# Patient Record
Sex: Female | Born: 1955
Health system: Southern US, Community
[De-identification: ages and names within clinical notes are randomized; demographics above are authoritative.]

## PROBLEM LIST (undated history)

## (undated) DIAGNOSIS — R001 Bradycardia, unspecified: Secondary | ICD-10-CM

## (undated) DIAGNOSIS — L719 Rosacea, unspecified: Secondary | ICD-10-CM

## (undated) DIAGNOSIS — I10 Essential (primary) hypertension: Secondary | ICD-10-CM

## (undated) HISTORY — PX: TONSILLECTOMY: SUR1361

## (undated) HISTORY — PX: NASAL SEPTUM SURGERY: SHX37

## (undated) HISTORY — DX: Bradycardia, unspecified: R00.1

## (undated) HISTORY — DX: Rosacea, unspecified: L71.9

## (undated) HISTORY — DX: Essential (primary) hypertension: I10

---

## 1999-10-09 ENCOUNTER — Other Ambulatory Visit: Admission: RE | Admit: 1999-10-09 | Discharge: 1999-10-09 | Payer: Self-pay | Admitting: Obstetrics and Gynecology

## 2000-02-28 ENCOUNTER — Encounter: Admission: RE | Admit: 2000-02-28 | Discharge: 2000-02-28 | Payer: Self-pay | Admitting: Obstetrics and Gynecology

## 2000-02-28 ENCOUNTER — Encounter: Payer: Self-pay | Admitting: Obstetrics and Gynecology

## 2000-06-12 ENCOUNTER — Other Ambulatory Visit: Admission: RE | Admit: 2000-06-12 | Discharge: 2000-06-12 | Payer: Self-pay | Admitting: Obstetrics and Gynecology

## 2001-03-18 ENCOUNTER — Encounter: Admission: RE | Admit: 2001-03-18 | Discharge: 2001-03-18 | Payer: Self-pay | Admitting: Internal Medicine

## 2001-03-18 ENCOUNTER — Encounter: Payer: Self-pay | Admitting: Obstetrics and Gynecology

## 2001-07-12 ENCOUNTER — Other Ambulatory Visit: Admission: RE | Admit: 2001-07-12 | Discharge: 2001-07-12 | Payer: Self-pay | Admitting: Obstetrics and Gynecology

## 2002-04-04 ENCOUNTER — Encounter: Admission: RE | Admit: 2002-04-04 | Discharge: 2002-04-04 | Payer: Self-pay | Admitting: Obstetrics and Gynecology

## 2002-04-04 ENCOUNTER — Encounter: Payer: Self-pay | Admitting: Obstetrics and Gynecology

## 2003-04-13 ENCOUNTER — Encounter: Admission: RE | Admit: 2003-04-13 | Discharge: 2003-04-13 | Payer: Self-pay | Admitting: *Deleted

## 2003-04-13 ENCOUNTER — Encounter: Payer: Self-pay | Admitting: *Deleted

## 2003-08-31 ENCOUNTER — Other Ambulatory Visit: Admission: RE | Admit: 2003-08-31 | Discharge: 2003-08-31 | Payer: Self-pay | Admitting: *Deleted

## 2004-05-09 ENCOUNTER — Encounter: Admission: RE | Admit: 2004-05-09 | Discharge: 2004-05-09 | Payer: Self-pay | Admitting: *Deleted

## 2004-09-05 ENCOUNTER — Other Ambulatory Visit: Admission: RE | Admit: 2004-09-05 | Discharge: 2004-09-05 | Payer: Self-pay | Admitting: *Deleted

## 2004-12-24 ENCOUNTER — Ambulatory Visit: Payer: Self-pay | Admitting: Internal Medicine

## 2005-05-27 ENCOUNTER — Encounter: Admission: RE | Admit: 2005-05-27 | Discharge: 2005-05-27 | Payer: Self-pay | Admitting: *Deleted

## 2005-06-26 ENCOUNTER — Ambulatory Visit: Payer: Self-pay | Admitting: Internal Medicine

## 2005-06-30 ENCOUNTER — Ambulatory Visit: Payer: Self-pay | Admitting: Internal Medicine

## 2005-09-03 ENCOUNTER — Other Ambulatory Visit: Admission: RE | Admit: 2005-09-03 | Discharge: 2005-09-03 | Payer: Self-pay | Admitting: *Deleted

## 2005-11-04 ENCOUNTER — Ambulatory Visit: Payer: Self-pay | Admitting: Pulmonary Disease

## 2005-11-05 ENCOUNTER — Ambulatory Visit: Payer: Self-pay | Admitting: Internal Medicine

## 2006-03-11 ENCOUNTER — Ambulatory Visit: Payer: Self-pay | Admitting: Internal Medicine

## 2006-06-08 ENCOUNTER — Encounter: Admission: RE | Admit: 2006-06-08 | Discharge: 2006-06-08 | Payer: Self-pay | Admitting: *Deleted

## 2006-08-13 ENCOUNTER — Ambulatory Visit: Payer: Self-pay | Admitting: Internal Medicine

## 2006-08-25 ENCOUNTER — Ambulatory Visit: Payer: Self-pay | Admitting: Internal Medicine

## 2006-09-01 ENCOUNTER — Other Ambulatory Visit: Admission: RE | Admit: 2006-09-01 | Discharge: 2006-09-01 | Payer: Self-pay | Admitting: *Deleted

## 2007-02-08 ENCOUNTER — Ambulatory Visit: Payer: Self-pay | Admitting: Internal Medicine

## 2007-06-10 ENCOUNTER — Encounter: Admission: RE | Admit: 2007-06-10 | Discharge: 2007-06-10 | Payer: Self-pay | Admitting: *Deleted

## 2007-07-08 ENCOUNTER — Ambulatory Visit: Payer: Self-pay | Admitting: Internal Medicine

## 2007-10-08 ENCOUNTER — Ambulatory Visit: Payer: Self-pay | Admitting: Internal Medicine

## 2008-06-27 ENCOUNTER — Encounter: Admission: RE | Admit: 2008-06-27 | Discharge: 2008-06-27 | Payer: Self-pay | Admitting: Family Medicine

## 2008-11-21 ENCOUNTER — Telehealth (INDEPENDENT_AMBULATORY_CARE_PROVIDER_SITE_OTHER): Payer: Self-pay | Admitting: *Deleted

## 2008-12-04 LAB — CONVERTED CEMR LAB: Pap Smear: NORMAL

## 2008-12-29 DIAGNOSIS — J309 Allergic rhinitis, unspecified: Secondary | ICD-10-CM | POA: Insufficient documentation

## 2008-12-29 DIAGNOSIS — J45998 Other asthma: Secondary | ICD-10-CM | POA: Insufficient documentation

## 2009-01-01 ENCOUNTER — Ambulatory Visit: Payer: Self-pay | Admitting: Internal Medicine

## 2009-02-08 ENCOUNTER — Ambulatory Visit: Payer: Self-pay | Admitting: Internal Medicine

## 2009-02-08 DIAGNOSIS — R9431 Abnormal electrocardiogram [ECG] [EKG]: Secondary | ICD-10-CM | POA: Insufficient documentation

## 2009-02-08 DIAGNOSIS — R030 Elevated blood-pressure reading, without diagnosis of hypertension: Secondary | ICD-10-CM | POA: Insufficient documentation

## 2009-02-08 LAB — CONVERTED CEMR LAB
Albumin: 4.2 g/dL (ref 3.5–5.2)
Alkaline Phosphatase: 90 units/L (ref 39–117)
BUN: 10 mg/dL (ref 6–23)
Basophils Relative: 0.4 % (ref 0.0–3.0)
Bilirubin Urine: NEGATIVE
Chloride: 105 meq/L (ref 96–112)
Cholesterol: 177 mg/dL (ref 0–200)
GFR calc Af Amer: 97 mL/min
Hemoglobin, Urine: NEGATIVE
Hemoglobin: 13.4 g/dL (ref 12.0–15.0)
Ketones, ur: NEGATIVE mg/dL
LDL Cholesterol: 92 mg/dL (ref 0–99)
Leukocytes, UA: NEGATIVE
Lymphocytes Relative: 22.9 % (ref 12.0–46.0)
Monocytes Absolute: 0.5 10*3/uL (ref 0.1–1.0)
Monocytes Relative: 6.7 % (ref 3.0–12.0)
Mucus, UA: NEGATIVE
Neutro Abs: 4.7 10*3/uL (ref 1.4–7.7)
Neutrophils Relative %: 67.6 % (ref 43.0–77.0)
RBC: 4.34 M/uL (ref 3.87–5.11)
RDW: 11.7 % (ref 11.5–14.6)
Specific Gravity, Urine: <=1.005 (ref 1.000–1.035)
TSH: 2.13 microintl units/mL (ref 0.35–5.50)
Total CHOL/HDL Ratio: 2.9
Total Protein, Urine: NEGATIVE mg/dL
Total Protein: 7.7 g/dL (ref 6.0–8.3)
Triglycerides: 116 mg/dL (ref 0–149)
WBC, UA: NONE SEEN cells/hpf
pH: 6 (ref 5.0–8.0)

## 2009-02-09 ENCOUNTER — Encounter: Payer: Self-pay | Admitting: Internal Medicine

## 2009-04-18 ENCOUNTER — Ambulatory Visit: Payer: Self-pay | Admitting: Internal Medicine

## 2009-07-11 ENCOUNTER — Encounter: Admission: RE | Admit: 2009-07-11 | Discharge: 2009-07-11 | Payer: Self-pay | Admitting: Internal Medicine

## 2010-02-25 ENCOUNTER — Ambulatory Visit: Payer: Self-pay | Admitting: Internal Medicine

## 2010-07-15 ENCOUNTER — Encounter: Admission: RE | Admit: 2010-07-15 | Discharge: 2010-07-15 | Payer: Self-pay | Admitting: Obstetrics and Gynecology

## 2011-01-02 NOTE — Assessment & Plan Note (Signed)
Summary: rov ///kp   Primary Provider/Referring Provider:  Etta Grandchild MD  CC:  Follow up visit-no complaints and breathing okay.Marland Kitchen  History of Present Illness:  10/09/07-:  HISTORY:  One year follow up. She says that she has done well over the past year until the past week when she has a cold treated with Mucinex DM. She plans to get flu vaccine at work. She continues allergy vaccine at 1 to 10 with no problems. We discussed this. She is basically satisfied with her care and status.   01-31-2009- Asthma, allergic rhinitis Asked to spend some time talking about changing primary doctors Asks about H1N1 vac- has had seasonal flu vax Admits mild nasal congestion but feels she has  been well.  Denies headache, purulent discharge or wheeze. She came off allergy shots. Taking only supplements with no decongestants or antihistamines. Using Advair, not needing rescue inhaler.  February 25, 2010- Asthma, allergic rhinitis No significant bronchitis since last here. She credits an herbal product. Didn't get flu shot this year, but I suggested she should continue to get it each year. She is more careful about dust control at home. Liked otc loratadine. Has not felt need to resume allergy shots.    Current Medications (verified): 1)  Advair Diskus 250-50 Mcg/dose Misc (Fluticasone-Salmeterol) .Marland Kitchen.. 1 Puff Two Times A Day; Rinse Mouth Well 2)  Proair Hfa 108 (90 Base) Mcg/act Aers (Albuterol Sulfate) .... 2 Puffs Four Times A Day As Needed 3)  Epipen 0.3 Mg/0.54ml (1:1000) Devi (Epinephrine Hcl (Anaphylaxis)) .... Use As Directed As Needed  Allergies (verified): No Known Drug Allergies  Past History:  Past Medical History: Last updated: 12/29/2008 Allergic Rhinitis Asthma  Past Surgical History: Last updated: 2009-01-31 Tonsillectomy Nasal septoplasty  Family History: Last updated: 2009/01/31 Father  died- MI Mother- died bowel obstruction, coronary artery disease  Social  History: Last updated: 04/18/2009 Works [employer] Patient states former smoker- was light social only Never married, no children Drug use-no Regular exercise-no  Risk Factors: Alcohol Use: 0 (04/18/2009) >5 drinks/d w/in last 3 months: no (02/08/2009) Caffeine Use: very little (02/08/2009) Diet: normal (02/08/2009) Exercise: no (04/18/2009)  Risk Factors: Smoking Status: never (04/18/2009)  Review of Systems      See HPI  The patient denies anorexia, fever, weight loss, weight gain, vision loss, decreased hearing, hoarseness, chest pain, syncope, dyspnea on exertion, peripheral edema, prolonged cough, headaches, hemoptysis, abdominal pain, and severe indigestion/heartburn.    Vital Signs:  Patient profile:   55 year old female Height:      67 inches O2 Sat:      98 % on Room air Pulse rate:   54 / minute BP sitting:   148 / 88  (right arm) Cuff size:   regular  Vitals Entered By: Reynaldo Minium CMA (February 25, 2010 9:29 AM)  O2 Flow:  Room air  Physical Exam  Additional Exam:  General: A/Ox3; pleasant and cooperative, NAD, SKIN: no rash, lesions NODES: no lymphadenopathy HEENT: [place]/AT, EOM- WNL, Conjuctivae- clear, PERRLA, TM-WNL, Nose- mild nasal stuffy, Throat- clear and wnl NECK: Supple w/ fair ROM, JVD- none, normal carotid impulses w/o bruits Thyroid- normal to palpation CHEST: Clear to P&A HEART: RRR, no m/g/r heard ABDOMEN: Soft and nl; EAV:WUJW, nl pulses, no edema  NEURO: Grossly intact to observation      Impression & Recommendations:  Problem # 1:  ALLERGIC RHINITIS (ICD-477.9)  Minor stuffiness. We discussed effect of decongestants on BP. She is aware of pollen and  dust effects and environmental cautions. We don't need to resume allergy vaccine now.  Orders: Est. Patient Level III (16109)  Problem # 2:  ASTHMA (ICD-493.90) We discussed option to reduce the Advair strength. She is only using it once daily and she was uncomfortable with the  change.  Patient Instructions: 1)  Please schedule a follow-up appointment in 1 year. 2)  Refill scripts for Advair and Proair Prescriptions: PROAIR HFA 108 (90 BASE) MCG/ACT AERS (ALBUTEROL SULFATE) 2 puffs four times a day as needed  #1 x prn   Entered and Authorized by:   Waymon Budge MD   Signed by:   Waymon Budge MD on 02/25/2010   Method used:   Print then Give to Patient   RxID:   [id number] ADVAIR DISKUS 250-50 MCG/DOSE MISC (FLUTICASONE-SALMETEROL) 1 puff two times a day; RINSE MOUTH WELL  #1 x prn   Entered and Authorized by:   Waymon Budge MD   Signed by:   Waymon Budge MD on 02/25/2010   Method used:   Print then Give to Patient   RxID:   [id number]

## 2011-02-18 ENCOUNTER — Encounter: Payer: Self-pay | Admitting: Internal Medicine

## 2011-02-25 ENCOUNTER — Encounter: Payer: Self-pay | Admitting: Internal Medicine

## 2011-02-25 ENCOUNTER — Ambulatory Visit (INDEPENDENT_AMBULATORY_CARE_PROVIDER_SITE_OTHER): Payer: Commercial Indemnity | Admitting: Internal Medicine

## 2011-02-25 VITALS — BP 138/72 | HR 53 | Ht 67.0 in

## 2011-02-25 DIAGNOSIS — J45909 Unspecified asthma, uncomplicated: Secondary | ICD-10-CM

## 2011-02-25 DIAGNOSIS — J309 Allergic rhinitis, unspecified: Secondary | ICD-10-CM

## 2011-02-25 MED ORDER — ALBUTEROL SULFATE HFA 108 (90 BASE) MCG/ACT IN AERS: 2.0000 | INHALATION_SPRAY | Freq: Four times a day (QID) | RESPIRATORY_TRACT | Status: DC | PRN

## 2011-02-25 MED ORDER — FLUTICASONE-SALMETEROL 100-50 MCG/DOSE IN AEPB: 1.0000 | INHALATION_SPRAY | Freq: Two times a day (BID) | RESPIRATORY_TRACT | Status: DC

## 2011-02-25 NOTE — Assessment & Plan Note (Addendum)
OTC antihistamines are sufficient now, but we discussed use in anticipation of exposure later in the Spring, as with lawn mowing. Also should wear a mask.  She has remained comfortable since completing allergy shots.

## 2011-02-25 NOTE — Patient Instructions (Addendum)
Try using sample/ script Advair 100/50 instead of 250-50. Same directions- one puff and rinse mouth, twice daily If this doesn't seem to hold you, we will go back to the 250/  Keep a script available for a rescue inhaler to use if Advair occasionally isn't enough.  Ok to use otc antihistamine of choice as needed for itch, drainage in nose and throat.

## 2011-02-25 NOTE — Progress Notes (Signed)
  Subjective:    Patient ID: [place] Haning, female    DOB: August 10, 1956, 55 y.o.   MRN: [id number]  HPI 50 yoF followed here for allergic rhinitis and asthma. No major health events in the past year. She is not having nasal congestion or drainage. Later Spring/ grass mowing is usually a worse time. As she mows more, she expects more throat irritation as pollen season evolves. She has needed to use a rescue inhaler only very rarely. We spent 15 minutes discussing relative merits of changing to low dose Advair, and of holistic/ alternative meds. Mask for mowing. She chose not to be weighed.    Review of Systems See HPI   Constitutional:   No weight loss, night sweats,  Fevers, chills, fatigue, lassitude. HEENT:   No headaches,  Difficulty swallowing,  Tooth/dental problems,  Sore throat,                No sneezing, itching, ear ache, nasal congestion, post nasal drip,   CV:  No chest pain,  Orthopnea, PND, swelling in lower extremities, anasarca, dizziness, palpitations  GI  No heartburn, indigestion, abdominal pain, nausea, vomiting, diarrhea, change in bowel habits, loss of appetite  Resp: No shortness of breath with exertion or at rest.  No excess mucus, no productive cough,  No non-productive cough,  No coughing up of blood.  No change in color of mucus.  No wheezing.    Skin: no rash or lesions.  GU: no dysuria, change in color of urine, no urgency or frequency.  No flank pain.  MS:  No joint pain or swelling.  No decreased range of motion.  No back pain.  Psych:  No change in mood or affect. No depression or anxiety.  No memory loss.  Objective:   Physical Exam    General- Alert, Oriented, Affect-appropriate, Distress- none acute  Skin- rash-none, lesions- none, excoriation- none  Lymphadenopathy- none  Head- atraumatic  Eyes- Gross vision intact, PERRLA, conjunctivae clear, secretions  Ears-Normal- Hearing, canals, Tm L ,   R ,  Nose- Clear, No-Septal dev, mucus, polyps,  erosion, perforation   Throat- Mallampati II , mucosa clear , drainage- none, tonsils- atrophic  Neck- flexible , trachea midline, no stridor , thyroid nl, carotid no bruit  Chest - symmetrical excursion , unlabored     Heart/CV- RRR , no murmur , no gallop  , no rub, nl s1 s2                     - JVD- none , edema- none, stasis changes- none, varices- none     Lung- clear to P&A, wheeze- none, cough- none , dullness-none, rub- none     Chest wall-   Abd- tender-no, distended-no, bowel sounds-present, HSM- no  Br/ Gen/ Rectal- Not done, not indicated  Extrem- cyanosis- none, clubbing, none, atrophy- none, strength- nl  Neuro- grossly intact to observation    Assessment & Plan:

## 2011-02-25 NOTE — Assessment & Plan Note (Addendum)
She is doing very well. I would like her to try lower dose Advair and to keep a rescue inhaler available.  Medication talk done.

## 2011-02-27 ENCOUNTER — Encounter: Payer: Self-pay | Admitting: Internal Medicine

## 2011-04-15 NOTE — Assessment & Plan Note (Signed)
[facility]                             PULMONARY OFFICE NOTE   NAME:Yvette Walton, Yvette Walton                         MRN:          [id number]  DATE:10/08/2007                            DOB:          1956-06-22    PROBLEMS:  1. Asthma.  2. Allergic rhinitis.   HISTORY:  One year follow up. She says that she has done well over the  past year until the past week when she has a cold treated with Mucinex  DM. She plans to get flu vaccine at work. She continues allergy vaccine  at 1 to 10 with no problems. We discussed this. She is basically  satisfied with her care and status.   MEDICATIONS:  1. Allergy vaccine.  2. Advair 250/50 is usually used once daily and was discussed.  3. Nortrel 1/35.  4. Pro Air albuterol inhaler.   She does have an EpiPen available.   ALLERGIES:  No medication allergy.   OBJECTIVE:  She refused to be weighed. Blood pressure 130/78, pulse  regular 46, room air saturation 99%. Conjunctivae, nasal mucosa,  pharynx, and chest are clear. Heart sounds are regular without murmur.  No evident rash.   IMPRESSION:  Allergic rhinitis and mild intermittent asthma are under  good control. Incidental viral syndrome URI seems mild and she is  managing it with over-the-counter medication.   PLAN:  Medications were reviewed. We refilled Advair and Pro Air HFA.  Schedule return 1 year, earlier p.r.n. She will continue vaccine.     Clinton D. Maple Hudson, MD, [facility], FACP  Electronically Signed    CDY/MedQ  DD: 10/09/2007  DT: 10/10/2007  Job #: [id number]   cc:   Windle Guard, M.D.

## 2011-04-18 NOTE — Assessment & Plan Note (Signed)
[facility]                               PULMONARY OFFICE NOTE   NAME:Dobkins, [place]                         MRN:          [id number]  DATE:08/25/2006                            DOB:          August 05, 1956   PROBLEM:  1. Asthma.  2. Allergic rhinitis.  3. Poison ivy dermatitis.   HISTORY:  She continues to give her own vaccine at 1:10.  We have discussed  risks and goals, reviewed policy concerning administration outside a medical  office, anaphylaxis and epinephrine, and have refilled her epinephrine  injector.  She says she was having a good summer after seeing the nurse  practitioner in December, with bronchitis.  She caught a cold in the past  week-and-a-half or so, and has taken some cough syrup.  We discussed  treatment options.  She says she begins a hacking cough if she is a few days  late with her allergy vaccine.  She has not had wheezing or purulent  discharge, significant sneezing or itching with this illness.   MEDICATIONS:  1. Allergy vaccine.  2. Advair 250/50.  3. Rare use of rescue albuterol.   ALLERGIES:  NO MEDICATION ALLERGIES.   PHYSICAL EXAMINATION:  VITAL SIGNS:  BP is 120/72, pulse is regular at 53,  room air saturation is 98%.  HEENT:  She has a nonproductive cough, mild head congestion with turbinate  edema and clear mucus.  Her throat looks a little red.  I do not hear  wheezing.  HEART:  Sounds are regular without murmur.   IMPRESSION:  1. Viral syndrome with upper respiratory illness, including rhinitis and      bronchitis.  2. Mild intermittent asthma.  3. Allergic rhinitis.   PLAN:  With the discussion as above, we are going to continue allergy  vaccine at 1:10.  Schedule return in one year.  Supportive care for the  acute illness, which is already getting better on its own.      Clinton D. Maple Hudson, MD, [facility], FACP   CDY/MedQ  DD:  08/26/2006 DT:  08/28/2006 Job #:  [id number]   cc:   [facility]

## 2011-07-08 ENCOUNTER — Other Ambulatory Visit: Payer: Self-pay | Admitting: Internal Medicine

## 2011-07-08 DIAGNOSIS — Z1231 Encounter for screening mammogram for malignant neoplasm of breast: Secondary | ICD-10-CM

## 2011-07-17 ENCOUNTER — Ambulatory Visit
Admission: RE | Admit: 2011-07-17 | Discharge: 2011-07-17 | Disposition: A | Discharge status: Home / Self Care | Payer: Commercial Indemnity | Source: Ambulatory Visit | Attending: Internal Medicine | Admitting: Internal Medicine

## 2011-07-17 DIAGNOSIS — Z1231 Encounter for screening mammogram for malignant neoplasm of breast: Secondary | ICD-10-CM

## 2011-07-18 ENCOUNTER — Other Ambulatory Visit: Payer: Self-pay | Admitting: Internal Medicine

## 2011-07-18 DIAGNOSIS — R928 Other abnormal and inconclusive findings on diagnostic imaging of breast: Secondary | ICD-10-CM

## 2011-07-28 ENCOUNTER — Ambulatory Visit
Admission: RE | Admit: 2011-07-28 | Discharge: 2011-07-28 | Disposition: A | Discharge status: Home / Self Care | Payer: Commercial Indemnity | Source: Ambulatory Visit | Attending: Internal Medicine | Admitting: Internal Medicine

## 2011-07-28 DIAGNOSIS — R928 Other abnormal and inconclusive findings on diagnostic imaging of breast: Secondary | ICD-10-CM

## 2011-09-18 LAB — HM PAP SMEAR: HM Pap smear: NORMAL

## 2012-02-25 ENCOUNTER — Encounter: Payer: Self-pay | Admitting: Internal Medicine

## 2012-02-25 ENCOUNTER — Ambulatory Visit (INDEPENDENT_AMBULATORY_CARE_PROVIDER_SITE_OTHER): Payer: Commercial Indemnity | Admitting: Internal Medicine

## 2012-02-25 VITALS — BP 110/74 | HR 55 | Ht 67.0 in

## 2012-02-25 DIAGNOSIS — J45909 Unspecified asthma, uncomplicated: Secondary | ICD-10-CM

## 2012-02-25 DIAGNOSIS — J309 Allergic rhinitis, unspecified: Secondary | ICD-10-CM

## 2012-02-25 MED ORDER — FLUTICASONE-SALMETEROL 100-50 MCG/DOSE IN AEPB: 1.0000 | INHALATION_SPRAY | Freq: Two times a day (BID) | RESPIRATORY_TRACT | Status: DC

## 2012-02-25 NOTE — Progress Notes (Signed)
Patient ID: Yvette Walton, female    DOB: 09-01-1956, 56 y.o.   MRN: [id number]  HPI  02/25/10-54 yoF former smoker followed here for allergic rhinitis and asthma. No major health events in the past year. She is not having nasal congestion or drainage. Later Spring/ grass mowing is usually a worse time. As she mows more, she expects more throat irritation as pollen season evolves. She has needed to use a rescue inhaler only very rarely. We spent 15 minutes discussing relative merits of changing to low dose Advair, and of holistic/ alternative meds. Mask for mowing. She chose not to be weighed.   02/25/12- 74 yoF former smoker  followed here for allergic rhinitis and asthma. PCP Dr Marcello Moores Doing well with allergies so far this spring. She has remained off of allergy vaccine for another year, using OTC antihistamines if needed. Gets an annual health screening at work. Her general health has been quite good. No wheezing, chest tightness or recognized asthma in a long time. Never filled her prescription for her rescue inhaler last year.  ROS-see HPI Constitutional:   No-   weight loss, night sweats, fevers, chills, fatigue, lassitude. HEENT:   No-  headaches, difficulty swallowing, tooth/dental problems, sore throat,       Occasional sneezing, itching, ear ache, nasal congestion, post nasal drip,  CV:  No-   chest pain, orthopnea, PND, swelling in lower extremities, anasarca, dizziness, palpitations Resp: No-   shortness of breath with exertion or at rest.              No-   productive cough,  No non-productive cough,  No- coughing up of blood.              No-   change in color of mucus.  No- wheezing.   Skin: No-   rash or lesions. GI:  No-   heartburn, indigestion, abdominal pain, nausea, vomiting,  GU: No-   dysuria,. MS:  No-   joint pain or swelling.   Neuro-     nothing unusual Psych:  No- change in mood or affect. No depression or anxiety.  No memory loss.   OBJ- Physical  Exam General- Alert, Oriented, Affect-appropriate, Distress- none acute Skin- rash-none, lesions- none, excoriation- none Lymphadenopathy- none Head- atraumatic            Eyes- Gross vision intact, PERRLA, conjunctivae and secretions clear            Ears- Hearing, canals-normal            Nose- Clear, no-Septal dev, mucus, polyps, erosion, perforation             Throat- Mallampati II , mucosa clear , drainage- none, tonsils- atrophic Neck- flexible , trachea midline, no stridor , thyroid nl, carotid no bruit Chest - symmetrical excursion , unlabored           Heart/CV- RRR , no murmur , no gallop  , no rub, nl s1 s2                           - JVD- none , edema- none, stasis changes- none, varices- none           Lung- clear to P&A, wheeze- none, cough- none , dullness-none, rub- none           Chest wall-  Abd-  Br/ Gen/ Rectal- Not done, not indicated Extrem- cyanosis- none, clubbing, none,  atrophy- none, strength- nl Neuro- grossly intact to observation

## 2012-02-25 NOTE — Patient Instructions (Signed)
Refilled Advair at the 100 strength.  Please call as needed

## 2012-03-02 ENCOUNTER — Encounter: Payer: Self-pay | Admitting: Internal Medicine

## 2012-03-02 NOTE — Assessment & Plan Note (Addendum)
Stable and controlled without intervention needed today. Plan-refill Advair

## 2012-03-02 NOTE — Assessment & Plan Note (Signed)
Good control so far this year with occasional antihistamine.

## 2012-03-31 ENCOUNTER — Ambulatory Visit: Payer: Commercial Indemnity | Admitting: Internal Medicine

## 2012-08-25 ENCOUNTER — Ambulatory Visit (INDEPENDENT_AMBULATORY_CARE_PROVIDER_SITE_OTHER): Payer: Commercial Indemnity | Admitting: Pulmonary Disease

## 2012-08-25 ENCOUNTER — Telehealth: Payer: Self-pay | Admitting: Internal Medicine

## 2012-08-25 ENCOUNTER — Encounter: Payer: Self-pay | Admitting: Pulmonary Disease

## 2012-08-25 VITALS — BP 130/80 | HR 54 | Temp 98.2°F | Ht 67.0 in

## 2012-08-25 DIAGNOSIS — J45909 Unspecified asthma, uncomplicated: Secondary | ICD-10-CM

## 2012-08-25 DIAGNOSIS — J309 Allergic rhinitis, unspecified: Secondary | ICD-10-CM

## 2012-08-25 MED ORDER — PREDNISONE 10 MG PO TABS: ORAL_TABLET | ORAL | Status: DC

## 2012-08-25 NOTE — Telephone Encounter (Signed)
Called, spoke with pt. C/o head congestion with clear mucus, chest congestion, and slight nonprod cough.  Symptoms started on Friday.  Denies increased SOB, wheezing, chest tightness, chest pain, c/s.  Unsure of fever.  Taking mucinex dm.  Requesting OV this week as she is going out of town next week.  Dr. Maple Hudson is off today and does not have any openings this week.  Pt ok with seeing another provider today.  Scheduled with [facility] for today, Sept 25 at 3:45 pm -- pt aware.

## 2012-08-25 NOTE — Assessment & Plan Note (Signed)
The patient's history is most consistent with a flare of her allergic rhinitis, that is now affecting her asthma.  I cannot exclude the possibility of a sinusitis, but she has not had any purulent mucus.  I will treat her with a short course of prednisone, and have also asked her to work on nasal hygiene.  This will also help any airway inflammation that may be causing a flareup of asthma.

## 2012-08-25 NOTE — Progress Notes (Signed)
  Subjective:    Patient ID: Yvette Walton, female    DOB: 17-Sep-1956, 56 y.o.   MRN: [id number]  HPI The patient comes in today for an acute sick visit.  She is followed in the office with asthma and allergic rhinitis, and gives a one-week history of worsening sinus and head congestion, which has now moved down into her chest.  She is having cough, but is not producing any mucus.  She has not had any fevers, chills, or sweats.  She has only a little bit more short of breath than usual.   Review of Systems  Constitutional: Negative for fever and unexpected weight change.  HENT: Negative for ear pain, nosebleeds, congestion, sore throat, rhinorrhea, sneezing, trouble swallowing, dental problem, postnasal drip and sinus pressure.   Eyes: Negative for redness and itching.  Respiratory: Negative for cough, chest tightness, shortness of breath and wheezing.   Cardiovascular: Negative for palpitations and leg swelling.  Gastrointestinal: Negative for nausea and vomiting.  Genitourinary: Negative for dysuria.  Musculoskeletal: Negative for joint swelling.  Skin: Negative for rash.  Neurological: Negative for headaches.  Hematological: Does not bruise/bleed easily.  Psychiatric/Behavioral: Negative for dysphoric mood. The patient is not nervous/anxious.        Objective:   Physical Exam Well-developed female in no acute distress Nose without purulence or significant edema/erythema Oropharynx clear Neck without lymphadenopathy or thyromegaly Chest totally clear to auscultation, no wheezing Cardiac exam with regular rate and rhythm Lower extremities without edema, no cyanosis Alert and oriented, moves all 4 extremities.       Assessment & Plan:

## 2012-08-25 NOTE — Patient Instructions (Addendum)
Will treat with a course of prednisone for your sinus and chest symptoms. No change in your usual breathing medications. Use neilmed sinus rinses am and pm to help clean out sinus congestion.  Once better, can use as needed.  Can continue mucinex as needed. If you begin to produce discolored mucus, let us know. followup with Dr. Maple Hudson at your usual visit.

## 2012-09-23 ENCOUNTER — Other Ambulatory Visit: Payer: Self-pay | Admitting: Internal Medicine

## 2012-09-23 DIAGNOSIS — Z1231 Encounter for screening mammogram for malignant neoplasm of breast: Secondary | ICD-10-CM

## 2012-10-15 ENCOUNTER — Ambulatory Visit
Admission: RE | Admit: 2012-10-15 | Discharge: 2012-10-15 | Disposition: A | Discharge status: Home / Self Care | Payer: Commercial Indemnity | Source: Ambulatory Visit | Attending: Internal Medicine | Admitting: Internal Medicine

## 2012-10-15 DIAGNOSIS — Z1231 Encounter for screening mammogram for malignant neoplasm of breast: Secondary | ICD-10-CM

## 2012-12-01 HISTORY — PX: COLONOSCOPY: SHX174

## 2013-01-17 ENCOUNTER — Telehealth: Payer: Self-pay | Admitting: Internal Medicine

## 2013-01-17 MED ORDER — BUDESONIDE-FORMOTEROL FUMARATE 160-4.5 MCG/ACT IN AERO: INHALATION_SPRAY | RESPIRATORY_TRACT | Status: DC

## 2013-01-17 NOTE — Telephone Encounter (Signed)
Pt states advair not covered on pts insurance anymore ,states the pharmacy told her  symbicort was a covered benefit.  No Known Allergies Dr Maple Hudson Please advise Thank you.

## 2013-01-17 NOTE — Telephone Encounter (Signed)
Called, spoke with pt.  I informed her ok to change advair to symbicort 160 2 puffs and rinse mouth bid.  Pt states she doesn't needs to fill symbicort rx at this time bc she has advair right now.  She is aware I have sent [facility]ort [facility], and she will call [facility] when symbicort rx is needed.  Advised pt symbicort technique is same as albuterol hfa technique.  Advised to call if she has any problems with new rx once she starts it, has questions about it, or if breathing worsens.  She verbalized understanding of these instructions and voiced no further questions or concerns at this time.  ** med list was updated.

## 2013-01-17 NOTE — Telephone Encounter (Signed)
Per CY-okay to change Symbicort 160/4.5 #1 2 puffs and Rinse mouth BID with prn refills.

## 2013-02-24 ENCOUNTER — Encounter: Payer: Self-pay | Admitting: Internal Medicine

## 2013-02-24 ENCOUNTER — Ambulatory Visit (INDEPENDENT_AMBULATORY_CARE_PROVIDER_SITE_OTHER): Payer: BC Managed Care – PPO | Admitting: Internal Medicine

## 2013-02-24 VITALS — BP 134/80 | HR 52 | Ht 67.0 in

## 2013-02-24 DIAGNOSIS — J309 Allergic rhinitis, unspecified: Secondary | ICD-10-CM

## 2013-02-24 DIAGNOSIS — J45909 Unspecified asthma, uncomplicated: Secondary | ICD-10-CM

## 2013-02-24 MED ORDER — ALBUTEROL SULFATE HFA 108 (90 BASE) MCG/ACT IN AERS: 2.0000 | INHALATION_SPRAY | Freq: Four times a day (QID) | RESPIRATORY_TRACT | Status: DC | PRN
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
## 2013-02-24 NOTE — Patient Instructions (Addendum)
Script for rescue albuterol HFA inhaler to use in addition to your maintenance inhaler as needed. You can give it to the drug store and ask them to put it on hold.  If you feel very stable, it is ok to use the maintenance inhaler only one time daily. Go back to twice daily as soon as you notice your asthma seeming worse.

## 2013-03-02 NOTE — Assessment & Plan Note (Signed)
Minimal rhinitis. No changes needed.

## 2013-03-02 NOTE — Assessment & Plan Note (Signed)
Very good control with Advair. Rarely needing rescue inhaler. No change indicated.

## 2013-08-18 ENCOUNTER — Other Ambulatory Visit: Payer: Self-pay

## 2013-08-18 DIAGNOSIS — Z1231 Encounter for screening mammogram for malignant neoplasm of breast: Secondary | ICD-10-CM

## 2013-08-19 ENCOUNTER — Encounter: Payer: Self-pay | Admitting: Internal Medicine

## 2013-10-04 ENCOUNTER — Ambulatory Visit (AMBULATORY_SURGERY_CENTER): Payer: Self-pay

## 2013-10-04 VITALS — Ht 66.0 in | Wt 156.6 lb

## 2013-10-04 DIAGNOSIS — Z1211 Encounter for screening for malignant neoplasm of colon: Secondary | ICD-10-CM

## 2013-10-04 MED ORDER — MOVIPREP 100 G PO SOLR: ORAL | Status: DC

## 2013-10-06 ENCOUNTER — Encounter: Payer: Self-pay | Admitting: Internal Medicine

## 2013-10-17 ENCOUNTER — Ambulatory Visit
Admission: RE | Admit: 2013-10-17 | Discharge: 2013-10-17 | Disposition: A | Discharge status: Home / Self Care | Payer: BC Managed Care – PPO | Source: Ambulatory Visit

## 2013-10-17 DIAGNOSIS — Z1231 Encounter for screening mammogram for malignant neoplasm of breast: Secondary | ICD-10-CM

## 2013-10-17 LAB — HM MAMMOGRAPHY: HM Mammogram: NORMAL

## 2013-10-20 ENCOUNTER — Encounter: Payer: BC Managed Care – PPO | Admitting: Internal Medicine

## 2013-10-24 ENCOUNTER — Ambulatory Visit (AMBULATORY_SURGERY_CENTER): Payer: BC Managed Care – PPO | Admitting: Internal Medicine

## 2013-10-24 ENCOUNTER — Encounter: Payer: Self-pay | Admitting: Internal Medicine

## 2013-10-24 VITALS — BP 132/75 | HR 45 | Temp 97.5°F | Resp 17 | Ht 66.0 in | Wt 156.0 lb

## 2013-10-24 DIAGNOSIS — Z1211 Encounter for screening for malignant neoplasm of colon: Secondary | ICD-10-CM

## 2013-10-24 MED ORDER — SODIUM CHLORIDE 0.9 % IV SOLN: 500.0000 mL | INTRAVENOUS | Status: DC

## 2013-10-24 NOTE — Op Note (Signed)
[facility] [address] [place], [address]   COLONOSCOPY PROCEDURE REPORT  PATIENT: Yvette Walton, Yvette Walton  MR#: [id number] BIRTHDATE: 1956/10/22 , 57  yrs. old GENDER: Female ENDOSCOPIST: Roxy Cedar, MD REFERRED NF:AOZHYQ Karsten Ro, M.D. PROCEDURE DATE:  10/24/2013 PROCEDURE:   Colonoscopy, screening First Screening Colonoscopy - Avg.  risk and is 50 yrs.  old or older Yes.  Prior Negative Screening - Now for repeat screening. N/A  History of Adenoma - Now for follow-up colonoscopy & has been > or = to 3 yrs.  N/A  Polyps Removed Today? No.  Recommend repeat exam, <10 yrs? No. ASA CLASS:   Class II INDICATIONS:average risk screening. MEDICATIONS: MAC sedation, administered by CRNA and propofol (Diprivan) 250mg  IV  DESCRIPTION OF PROCEDURE:   After the risks benefits and alternatives of the procedure were thoroughly explained, informed consent was obtained.  A digital rectal exam revealed no abnormalities of the rectum.   The LB PFC-H190 [id number]  endoscope was introduced through the anus and advanced to the cecum, which was identified by both the appendix and ileocecal valve. No adverse events experienced.   The quality of the prep was good, using MoviPrep  The instrument was then slowly withdrawn as the colon was fully examined.    COLON FINDINGS: Mild diverticulosis was noted The finding was in the left colon.   The colon was otherwise normal.  There was no inflammation, polyps or cancers .  Retroflexed views revealed no abnormalities. The time to cecum=4 minutes 55 seconds.  Withdrawal time=10 minutes 43 seconds.  The scope was withdrawn and the procedure completed. COMPLICATIONS: There were no complications.  ENDOSCOPIC IMPRESSION: 1.   Mild diverticulosis was noted in the left colon 2.   The colon was otherwise normal  RECOMMENDATIONS: 1. Continue current colorectal screening recommendations for "routine risk" patients with a repeat colonoscopy in 10  years.   eSigned:  Roxy Cedar, MD 10/24/2013 11:11 AM   cc: The Patient and Etta Grandchild, MD

## 2013-10-24 NOTE — Progress Notes (Signed)
Procedure ends, to recovery, report given and VSS. 

## 2013-10-24 NOTE — Progress Notes (Signed)
Patient did not experience any of the following events: a burn prior to discharge; a fall within the facility; wrong site/side/patient/procedure/implant event; or a hospital transfer or hospital admission upon discharge from the facility. (G8907) Patient did not have preoperative order for IV antibiotic SSI prophylaxis. (G8918)  

## 2013-10-24 NOTE — Patient Instructions (Signed)
YOU HAD AN ENDOSCOPIC PROCEDURE TODAY AT THE [facility]: Refer to the procedure report that was given to you for any specific questions about what was found during the examination.  If the procedure report does not answer your questions, please call your gastroenterologist to clarify.  If you requested that your care partner not be given the details of your procedure findings, then the procedure report has been included in a sealed envelope for you to review at your convenience later.  YOU SHOULD EXPECT: Some feelings of bloating in the abdomen. Passage of more gas than usual.  Walking can help get rid of the air that was put into your GI tract during the procedure and reduce the bloating. If you had a lower endoscopy (such as a colonoscopy or flexible sigmoidoscopy) you may notice spotting of blood in your stool or on the toilet paper. If you underwent a bowel prep for your procedure, then you may not have a normal bowel movement for a few days.  DIET: Your first meal following the procedure should be a light meal and then it is ok to progress to your normal diet.  A half-sandwich or bowl of soup is an example of a good first meal.  Heavy or fried foods are harder to digest and may make you feel nauseous or bloated.  Likewise meals heavy in dairy and vegetables can cause extra gas to form and this can also increase the bloating.  Drink plenty of fluids but you should avoid alcoholic beverages for 24 hours.  ACTIVITY: Your care partner should take you home directly after the procedure.  You should plan to take it easy, moving slowly for the rest of the day.  You can resume normal activity the day after the procedure however you should NOT DRIVE or use heavy machinery for 24 hours (because of the sedation medicines used during the test).    SYMPTOMS TO REPORT IMMEDIATELY: A gastroenterologist can be reached at any hour.  During normal business hours, 8:30 AM to 5:00 PM Monday through Friday,  call [phone].  After hours and on weekends, please call the GI answering service at [phone] who will take a message and have the physician on call contact you.   Following lower endoscopy (colonoscopy or flexible sigmoidoscopy):  Excessive amounts of blood in the stool  Significant tenderness or worsening of abdominal pains  Swelling of the abdomen that is new, acute  Fever of 100F or higher  FOLLOW UP: If any biopsies were taken you will be contacted by phone or by letter within the next 1-3 weeks.  Call your gastroenterologist if you have not heard about the biopsies in 3 weeks.  Our staff will call the home number listed on your records the next business day following your procedure to check on you and address any questions or concerns that you may have at that time regarding the information given to you following your procedure. This is a courtesy call and so if there is no answer at the home number and we have not heard from you through the emergency physician on call, we will assume that you have returned to your regular daily activities without incident.  SIGNATURES/CONFIDENTIALITY: You and/or your care partner have signed paperwork which will be entered into your electronic medical record.  These signatures attest to the fact that that the information above on your After Visit Summary has been reviewed and is understood.  Full responsibility of the confidentiality of this   discharge information lies with you and/or your care-partner.  Resume medications. Information given on diverticulosis and high fiber diet with discharge instructions. 

## 2013-10-25 ENCOUNTER — Telehealth: Payer: Self-pay | Admitting: *Deleted

## 2013-10-25 NOTE — Telephone Encounter (Signed)
  Follow up Call-  Call back number 10/24/2013  Post procedure Call Back phone  # [phone]  Permission to leave phone message Yes     Patient questions:  Do you have a fever, pain , or abdominal swelling? no Pain Score  0 *  Have you tolerated food without any problems? yes  Have you been able to return to your normal activities? yes  Do you have any questions about your discharge instructions: Diet   no Medications  no Follow up visit  no  Do you have questions or concerns about your Care? no  Actions: * If pain score is 4 or above: No action needed, pain <4.

## 2014-02-24 ENCOUNTER — Ambulatory Visit (INDEPENDENT_AMBULATORY_CARE_PROVIDER_SITE_OTHER): Payer: BC Managed Care – PPO | Admitting: Internal Medicine

## 2014-02-24 ENCOUNTER — Encounter: Payer: Self-pay | Admitting: Internal Medicine

## 2014-02-24 VITALS — BP 120/84 | HR 53 | Ht 67.0 in | Wt 147.0 lb

## 2014-02-24 DIAGNOSIS — J45909 Unspecified asthma, uncomplicated: Secondary | ICD-10-CM

## 2014-02-24 DIAGNOSIS — J309 Allergic rhinitis, unspecified: Secondary | ICD-10-CM

## 2014-02-24 DIAGNOSIS — J45998 Other asthma: Secondary | ICD-10-CM

## 2014-02-24 NOTE — Patient Instructions (Signed)
Ok to try off Symbicort. Restart it if needed  You can follow with Dr Yetta BarreJones as your primary physician. We will be happy to see you as needed.
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
## 2014-02-28 ENCOUNTER — Encounter: Payer: Self-pay | Admitting: Podiatry

## 2014-02-28 ENCOUNTER — Ambulatory Visit (INDEPENDENT_AMBULATORY_CARE_PROVIDER_SITE_OTHER): Payer: BC Managed Care – PPO | Admitting: Podiatry

## 2014-02-28 VITALS — BP 148/85 | HR 60 | Ht 67.0 in | Wt 147.0 lb

## 2014-02-28 DIAGNOSIS — M21961 Unspecified acquired deformity of right lower leg: Secondary | ICD-10-CM

## 2014-02-28 DIAGNOSIS — M21969 Unspecified acquired deformity of unspecified lower leg: Secondary | ICD-10-CM

## 2014-02-28 DIAGNOSIS — M722 Plantar fascial fibromatosis: Secondary | ICD-10-CM

## 2014-02-28 NOTE — Progress Notes (Signed)
Subjective: 58 year old female presents stating her right foot hurt. It stopped hurting for a week, and before then was hurting for 2 weeks.  Using dress shoes with moderately elevated heel. Has been walking during lunch hour for 30 minutes.   Review of Systems - General ROS: negative for - chills, fatigue, fever, night sweats, sleep disturbance or weight loss Ophthalmic ROS: Wears corrective lens. ENT ROS: negative Breast ROS: negative for breast lumps Respiratory ROS: no cough, shortness of breath, or wheezing Cardiovascular ROS: no chest pain or dyspnea on exertion Gastrointestinal ROS: no abdominal pain, change in bowel habits, or black or bloody stools Genito-Urinary ROS: no dysuria, trouble voiding, or hematuria Musculoskeletal ROS: negative Neurological ROS: negative Dermatological ROS: negative.  Objective: Dermatologic: Normal findings. Vascular: All pedal pulses are palpable. No edema or erythema noted. Neurologic: All epicritic and tactile sensations grossly intact. Orthopedic: Positive of hypermobile first ray, excess sagittal plane motion of the first ray right. Hallux valgus with bunion right.    Assessment: Plantar fasciitis mid arch right. Hypermobile first ray right. Hallux valgus with bunion right.  Plan: Reviewed clinical findings and available treatment options including change in activity, shoes, orthotics and metatarsal binder.  Metatarsal binder dispensed (medium). Will prepare for Orthotics on her next visit.

## 2014-02-28 NOTE — Patient Instructions (Addendum)
Seen for painful arch. Reviewed findings. Need Orthotic shoe inserts.

## 2014-03-22 ENCOUNTER — Ambulatory Visit (INDEPENDENT_AMBULATORY_CARE_PROVIDER_SITE_OTHER): Payer: BC Managed Care – PPO | Admitting: Internal Medicine

## 2014-03-22 ENCOUNTER — Other Ambulatory Visit (INDEPENDENT_AMBULATORY_CARE_PROVIDER_SITE_OTHER): Payer: BC Managed Care – PPO

## 2014-03-22 ENCOUNTER — Encounter: Payer: Self-pay | Admitting: Internal Medicine

## 2014-03-22 VITALS — BP 134/82 | HR 54 | Temp 97.7°F | Resp 16 | Ht 66.14 in | Wt 156.2 lb

## 2014-03-22 DIAGNOSIS — R030 Elevated blood-pressure reading, without diagnosis of hypertension: Secondary | ICD-10-CM

## 2014-03-22 DIAGNOSIS — Z0000 Encounter for general adult medical examination without abnormal findings: Secondary | ICD-10-CM

## 2014-03-22 DIAGNOSIS — J45998 Other asthma: Secondary | ICD-10-CM

## 2014-03-22 DIAGNOSIS — J309 Allergic rhinitis, unspecified: Secondary | ICD-10-CM

## 2014-03-22 DIAGNOSIS — Z23 Encounter for immunization: Secondary | ICD-10-CM

## 2014-03-22 LAB — LIPID PANEL
CHOLESTEROL: 170 mg/dL (ref 0–200)
HDL: 50 mg/dL (ref >39.00)
LDL CALC: 84 mg/dL (ref 0–99)
TRIGLYCERIDES: 180 mg/dL — AB (ref 0.0–149.0)
Total CHOL/HDL Ratio: 3
VLDL: 36 mg/dL (ref 0.0–40.0)

## 2014-03-22 LAB — CBC WITH DIFFERENTIAL/PLATELET
BASOS PCT: 0.4 % (ref 0.0–3.0)
Basophils Absolute: 0 10*3/uL (ref 0.0–0.1)
EOS ABS: 0.2 10*3/uL (ref 0.0–0.7)
Eosinophils Relative: 2.9 % (ref 0.0–5.0)
HCT: 39.8 % (ref 36.0–46.0)
HEMOGLOBIN: 13.2 g/dL (ref 12.0–15.0)
Lymphocytes Relative: 23.9 % (ref 12.0–46.0)
Lymphs Abs: 1.4 10*3/uL (ref 0.7–4.0)
MCHC: 33.2 g/dL (ref 30.0–36.0)
MCV: 90.4 fl (ref 78.0–100.0)
MONO ABS: 0.5 10*3/uL (ref 0.1–1.0)
Monocytes Relative: 7.6 % (ref 3.0–12.0)
Neutro Abs: 3.9 10*3/uL (ref 1.4–7.7)
Neutrophils Relative %: 65.2 % (ref 43.0–77.0)
Platelets: 249 10*3/uL (ref 150.0–400.0)
RBC: 4.4 Mil/uL (ref 3.87–5.11)
RDW: 13.1 % (ref 11.5–14.6)
WBC: 6 10*3/uL (ref 4.5–10.5)

## 2014-03-22 LAB — COMPREHENSIVE METABOLIC PANEL
ALK PHOS: 95 U/L (ref 39–117)
ALT: 16 U/L (ref 0–35)
AST: 19 U/L (ref 0–37)
Albumin: 4 g/dL (ref 3.5–5.2)
BUN: 10 mg/dL (ref 6–23)
CO2: 29 mEq/L (ref 19–32)
CREATININE: 0.8 mg/dL (ref 0.4–1.2)
Calcium: 9.5 mg/dL (ref 8.4–10.5)
Chloride: 105 mEq/L (ref 96–112)
GFR: 80.62 mL/min (ref >60.00)
Glucose, Bld: 89 mg/dL (ref 70–99)
Potassium: 4.1 mEq/L (ref 3.5–5.1)
Sodium: 140 mEq/L (ref 135–145)
Total Bilirubin: 0.6 mg/dL (ref 0.3–1.2)
Total Protein: 7.7 g/dL (ref 6.0–8.3)

## 2014-03-22 LAB — TSH: TSH: 1.61 u[IU]/mL (ref 0.35–5.50)

## 2014-03-22 NOTE — Progress Notes (Signed)
Pre visit review using our clinic review tool, if applicable. No additional management support is needed unless otherwise documented below in the visit note. 

## 2014-03-22 NOTE — Progress Notes (Signed)
   Subjective:    Patient ID: Yvette Walton, female    DOB: 1956-01-29, 58 y.o.   MRN: [id number]  HPI Comments: She returns for a physical and tells me that she feels well and offers no complaints.     Review of Systems  Constitutional: Negative.  Negative for fever, chills, diaphoresis, appetite change and fatigue.  HENT: Negative.   Eyes: Negative.   Respiratory: Negative.  Negative for cough, choking, chest tightness, shortness of breath and stridor.   Cardiovascular: Negative.  Negative for chest pain, palpitations and leg swelling.  Gastrointestinal: Negative.  Negative for nausea, vomiting, abdominal pain, diarrhea and constipation.  Endocrine: Negative.   Genitourinary: Negative.  Negative for dysuria and difficulty urinating.  Musculoskeletal: Negative.   Skin: Negative.   Allergic/Immunologic: Negative.   Neurological: Negative.  Negative for dizziness, tremors, weakness and light-headedness.  Hematological: Negative.  Negative for adenopathy. Does not bruise/bleed easily.  Psychiatric/Behavioral: Negative.   All other systems reviewed and are negative.      Objective:   Physical Exam  Vitals reviewed. Constitutional: She is oriented to person, place, and time. She appears well-developed and well-nourished. No distress.  HENT:  Head: Normocephalic and atraumatic.  Mouth/Throat: Oropharynx is clear and moist. No oropharyngeal exudate.  Eyes: Conjunctivae are normal. Right eye exhibits no discharge. Left eye exhibits no discharge. No scleral icterus.  Neck: Normal range of motion. Neck supple. No JVD present. No tracheal deviation present. No thyromegaly present.  Cardiovascular: Normal rate, regular rhythm, normal heart sounds and intact distal pulses.  Exam reveals no gallop and no friction rub.   No murmur heard. Pulmonary/Chest: Effort normal and breath sounds normal. No stridor. No respiratory distress. She has no wheezes. She has no rales. She exhibits no  tenderness.  Abdominal: Soft. Bowel sounds are normal. She exhibits no distension and no mass. There is no tenderness. There is no rebound and no guarding.  Musculoskeletal: Normal range of motion. She exhibits no edema and no tenderness.  Lymphadenopathy:    She has no cervical adenopathy.  Neurological: She is oriented to person, place, and time.  Skin: Skin is warm and dry. No rash noted. She is not diaphoretic. No erythema. No pallor.  Psychiatric: She has a normal mood and affect. Her behavior is normal. Judgment and thought content normal.     Lab Results  Component Value Date   WBC 7.0 02/08/2009   HGB 13.4 02/08/2009   HCT 39.2 02/08/2009   PLT 208 02/08/2009   GLUCOSE 102* 02/08/2009   CHOL 177 02/08/2009   TRIG 116 02/08/2009   HDL 62.0 02/08/2009   LDLCALC 92 02/08/2009   ALT 19 02/08/2009   AST 20 02/08/2009   NA 141 02/08/2009   K 4.2 02/08/2009   CL 105 02/08/2009   CREATININE 0.8 02/08/2009   BUN 10 02/08/2009   CO2 30 02/08/2009   TSH 2.13 02/08/2009       Assessment & Plan:

## 2014-03-22 NOTE — Assessment & Plan Note (Signed)
Exam done Vaccines were reviewed and updated Labs ordered Pt ed material was given 

## 2014-03-22 NOTE — Patient Instructions (Signed)

## 2014-03-25 NOTE — Assessment & Plan Note (Signed)
Controlled without seasonal exacerbation so far this spring

## 2014-03-25 NOTE — Assessment & Plan Note (Signed)
Well controlled on low-dose maintenance therapy Plan-okay to try off of Symbicort. It may no longer be needed

## 2014-03-27 ENCOUNTER — Encounter (INDEPENDENT_AMBULATORY_CARE_PROVIDER_SITE_OTHER): Payer: Self-pay

## 2014-03-27 ENCOUNTER — Ambulatory Visit (INDEPENDENT_AMBULATORY_CARE_PROVIDER_SITE_OTHER): Payer: BC Managed Care – PPO | Admitting: Adult Health

## 2014-03-27 ENCOUNTER — Telehealth: Payer: Self-pay | Admitting: Internal Medicine

## 2014-03-27 ENCOUNTER — Encounter: Payer: Self-pay | Admitting: Adult Health

## 2014-03-27 VITALS — BP 108/66 | HR 72 | Temp 98.8°F | Ht 67.0 in | Wt 154.6 lb

## 2014-03-27 DIAGNOSIS — J45901 Unspecified asthma with (acute) exacerbation: Secondary | ICD-10-CM

## 2014-03-27 MED ORDER — AZITHROMYCIN 250 MG PO TABS: ORAL_TABLET | ORAL | Status: AC

## 2014-03-27 MED ORDER — PREDNISONE 10 MG PO TABS: ORAL_TABLET | ORAL | Status: DC

## 2014-03-27 NOTE — Assessment & Plan Note (Addendum)
Flare with URI /AR  xopenex neb x 1 in office w/ improved aeration  Plan  Prednisone taper over next week.  Mucinex DM Twice daily  As needed  Cough/congestion  Allegra 180mg  daily As needed  Drainage  Tylenol As needed   Fluids and rest .  Restart Symbicort 2 puff Twice daily  For 1 week then back to 1 puff daily .  Zpack to have on hold if symptoms worsen with discolored mucus.  Please contact office for sooner follow up if symptoms do not improve or worsen or seek emergency care  Follow up Dr. Maple HudsonYoung  As planned and As needed

## 2014-03-27 NOTE — Patient Instructions (Signed)
Prednisone taper over next week.  Mucinex DM Twice daily  As needed  Cough/congestion  Allegra 180mg  daily As needed  Drainage  Tylenol As needed   Fluids and rest .  Restart Symbicort 2 puff Twice daily  For 1 week then back to 1 puff daily .  Zpack to have on hold if symptoms worsen with discolored mucus.  Please contact office for sooner follow up if symptoms do not improve or worsen or seek emergency care  Follow up Dr. Maple HudsonYoung  As planned and As needed

## 2014-03-27 NOTE — Telephone Encounter (Signed)
Called, spoke with pt.  Reports symptoms started with a runny nose and have progressed to nasal congestion, PND, chest congestion, wheezing, increased SOB, and coughing a lot.  Pt is requesting appt this morning.  We have scheduled pt to see TP today at 9 am -- ok per CY, pt aware and voiced no further questions or concerns at this time.
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
## 2014-04-13 ENCOUNTER — Other Ambulatory Visit: Payer: Self-pay | Admitting: Internal Medicine

## 2014-04-13 DIAGNOSIS — M8580 Other specified disorders of bone density and structure, unspecified site: Secondary | ICD-10-CM | POA: Insufficient documentation

## 2014-05-02 ENCOUNTER — Ambulatory Visit
Admission: RE | Admit: 2014-05-02 | Discharge: 2014-05-02 | Disposition: A | Discharge status: Home / Self Care | Payer: BC Managed Care – PPO | Source: Ambulatory Visit | Attending: Internal Medicine | Admitting: Internal Medicine

## 2014-05-02 ENCOUNTER — Encounter (INDEPENDENT_AMBULATORY_CARE_PROVIDER_SITE_OTHER): Payer: Self-pay

## 2014-05-02 DIAGNOSIS — M8580 Other specified disorders of bone density and structure, unspecified site: Secondary | ICD-10-CM

## 2014-05-02 LAB — HM DEXA SCAN: HM Dexa Scan: -2.1

## 2014-05-03 ENCOUNTER — Encounter: Payer: Self-pay | Admitting: Internal Medicine

## 2014-05-10 ENCOUNTER — Telehealth: Payer: Self-pay | Admitting: *Deleted

## 2014-05-10 NOTE — Telephone Encounter (Signed)
Pt called states she would like to take supplements vs prescription medications.  She also requests which supplements/vitamins with dosages that is recommended by you.  She further requests what prescription medications were you thinking about prescribing.  Please advise

## 2014-05-10 NOTE — Telephone Encounter (Signed)
I assume these questions relate to osteoporosis Supplements would be Vit D 2000 iu per day Med would something like fosamax

## 2014-05-11 NOTE — Telephone Encounter (Signed)
Pt states that she will continue to take the vitamin d 2000 iu supplement, instead of the fosamax

## 2014-05-11 NOTE — Telephone Encounter (Signed)
Left message to call back  

## 2014-09-26 ENCOUNTER — Other Ambulatory Visit: Payer: Self-pay

## 2014-09-26 DIAGNOSIS — Z1239 Encounter for other screening for malignant neoplasm of breast: Secondary | ICD-10-CM

## 2014-10-16 ENCOUNTER — Other Ambulatory Visit: Payer: Self-pay

## 2014-10-16 DIAGNOSIS — Z1231 Encounter for screening mammogram for malignant neoplasm of breast: Secondary | ICD-10-CM

## 2014-10-18 ENCOUNTER — Ambulatory Visit
Admission: RE | Admit: 2014-10-18 | Discharge: 2014-10-18 | Disposition: A | Discharge status: Home / Self Care | Payer: BC Managed Care – PPO | Source: Ambulatory Visit

## 2014-10-18 ENCOUNTER — Encounter (INDEPENDENT_AMBULATORY_CARE_PROVIDER_SITE_OTHER): Payer: Self-pay

## 2014-10-18 DIAGNOSIS — Z1231 Encounter for screening mammogram for malignant neoplasm of breast: Secondary | ICD-10-CM

## 2014-10-18 LAB — HM MAMMOGRAPHY: HM Mammogram: NORMAL

## 2015-09-25 ENCOUNTER — Other Ambulatory Visit: Payer: Self-pay

## 2015-09-25 DIAGNOSIS — Z1231 Encounter for screening mammogram for malignant neoplasm of breast: Secondary | ICD-10-CM

## 2015-10-08 ENCOUNTER — Encounter: Payer: Self-pay | Admitting: Internal Medicine

## 2015-10-22 ENCOUNTER — Ambulatory Visit
Admission: RE | Admit: 2015-10-22 | Discharge: 2015-10-22 | Disposition: A | Discharge status: Home / Self Care | Payer: 59 | Source: Ambulatory Visit

## 2015-10-22 DIAGNOSIS — Z1231 Encounter for screening mammogram for malignant neoplasm of breast: Secondary | ICD-10-CM

## 2015-10-23 LAB — HM MAMMOGRAPHY: HM MAMMO: NORMAL

## 2015-11-09 ENCOUNTER — Encounter: Payer: Self-pay | Admitting: Internal Medicine

## 2016-09-22 ENCOUNTER — Other Ambulatory Visit: Payer: Self-pay | Admitting: Internal Medicine

## 2016-09-22 DIAGNOSIS — Z0000 Encounter for general adult medical examination without abnormal findings: Secondary | ICD-10-CM

## 2016-10-27 ENCOUNTER — Ambulatory Visit
Admission: RE | Admit: 2016-10-27 | Discharge: 2016-10-27 | Disposition: A | Discharge status: Home / Self Care | Payer: 59 | Source: Ambulatory Visit | Attending: Internal Medicine | Admitting: Internal Medicine

## 2016-10-27 DIAGNOSIS — Z0000 Encounter for general adult medical examination without abnormal findings: Secondary | ICD-10-CM

## 2017-08-24 DIAGNOSIS — J45909 Unspecified asthma, uncomplicated: Secondary | ICD-10-CM | POA: Diagnosis not present

## 2017-08-26 ENCOUNTER — Encounter: Payer: Self-pay | Admitting: Internal Medicine

## 2017-08-26 ENCOUNTER — Ambulatory Visit (INDEPENDENT_AMBULATORY_CARE_PROVIDER_SITE_OTHER): Payer: BLUE CROSS/BLUE SHIELD | Admitting: Internal Medicine

## 2017-08-26 VITALS — BP 120/70 | HR 55 | Temp 98.3°F | Resp 16 | Ht 67.0 in | Wt 160.0 lb

## 2017-08-26 DIAGNOSIS — R05 Cough: Secondary | ICD-10-CM | POA: Diagnosis not present

## 2017-08-26 DIAGNOSIS — J4521 Mild intermittent asthma with (acute) exacerbation: Secondary | ICD-10-CM

## 2017-08-26 DIAGNOSIS — B9789 Other viral agents as the cause of diseases classified elsewhere: Secondary | ICD-10-CM

## 2017-08-26 DIAGNOSIS — R059 Cough, unspecified: Secondary | ICD-10-CM

## 2017-08-26 DIAGNOSIS — J069 Acute upper respiratory infection, unspecified: Secondary | ICD-10-CM

## 2017-08-26 DIAGNOSIS — Z23 Encounter for immunization: Secondary | ICD-10-CM

## 2017-08-26 LAB — POCT EXHALED NITRIC OXIDE: FeNO level (ppb): 39

## 2017-08-26 MED ORDER — BUDESONIDE-FORMOTEROL FUMARATE 160-4.5 MCG/ACT IN AERO: INHALATION_SPRAY | RESPIRATORY_TRACT | 1 refills | Status: DC

## 2017-08-26 MED ORDER — METHYLPREDNISOLONE 4 MG PO TBPK: ORAL_TABLET | ORAL | 0 refills | Status: DC

## 2017-08-26 MED ORDER — PROMETHAZINE-DM 6.25-15 MG/5ML PO SYRP: 5.0000 mL | ORAL_SOLUTION | Freq: Four times a day (QID) | ORAL | 0 refills | Status: DC | PRN

## 2017-08-26 NOTE — Patient Instructions (Signed)
Cough, Adult Coughing is a reflex that clears your throat and your airways. Coughing helps to heal and protect your lungs. It is normal to cough occasionally, but a cough that happens with other symptoms or lasts a long time may be a sign of a condition that needs treatment. A cough may last only 2-3 weeks (acute), or it may last longer than 8 weeks (chronic). What are the causes? Coughing is commonly caused by:  Breathing in substances that irritate your lungs.  A viral or bacterial respiratory infection.  Allergies.  Asthma.  Postnasal drip.  Smoking.  Acid backing up from the stomach into the esophagus (gastroesophageal reflux).  Certain medicines.  Chronic lung problems, including COPD (or rarely, lung cancer).  Other medical conditions such as heart failure.  Follow these instructions at home: Pay attention to any changes in your symptoms. Take these actions to help with your discomfort:  Take medicines only as told by your health care provider. ? If you were prescribed an antibiotic medicine, take it as told by your health care provider. Do not stop taking the antibiotic even if you start to feel better. ? Talk with your health care provider before you take a cough suppressant medicine.  Drink enough fluid to keep your urine clear or pale yellow.  If the air is dry, use a cold steam vaporizer or humidifier in your bedroom or your home to help loosen secretions.  Avoid anything that causes you to cough at work or at home.  If your cough is worse at night, try sleeping in a semi-upright position.  Avoid cigarette smoke. If you smoke, quit smoking. If you need help quitting, ask your health care provider.  Avoid caffeine.  Avoid alcohol.  Rest as needed.  Contact a health care provider if:  You have new symptoms.  You cough up pus.  Your cough does not get better after 2-3 weeks, or your cough gets worse.  You cannot control your cough with suppressant  medicines and you are losing sleep.  You develop pain that is getting worse or pain that is not controlled with pain medicines.  You have a fever.  You have unexplained weight loss.  You have night sweats. Get help right away if:  You cough up blood.  You have difficulty breathing.  Your heartbeat is very fast. This information is not intended to replace advice given to you by your health care provider. Make sure you discuss any questions you have with your health care provider. Document Released: 05/16/2011 Document Revised: 04/24/2016 Document Reviewed: 01/24/2015 Elsevier Interactive Patient Education  2017 [employer]  

## 2017-08-26 NOTE — Progress Notes (Signed)
Subjective:  Patient ID: Yvette Walton, female    DOB: 07/06/1956  Age: 61 y.o. MRN: [id number]  CC: Cough and Asthma   HPI Yvette Walton presents for a 10 day history of nonproductive cough, congestion, wheezing, and shortness of breath. She was seen elsewhere and started on doxycycline with some symptom improvement. She is also using Symbicort.  The cough keeps her awake at night and she is not taking a cough suppressant.  Outpatient Medications Prior to Visit  Medication Sig Dispense Refill  . albuterol (PROVENTIL HFA) 108 (90 BASE) MCG/ACT inhaler Inhale 2 puffs into the lungs every 6 (six) hours as needed for wheezing or shortness of breath. 1 Inhaler prn  . NON FORMULARY Green Miracle-Take 6 capsules daily    . budesonide-formoterol (SYMBICORT) 160-4.5 MCG/ACT inhaler 2 puffs and Rinse mouth twice daily 1 Inhaler prn  . predniSONE (DELTASONE) 10 MG tablet 4 tabs for 2 days, then 3 tabs for 2 days, 2 tabs for 2 days, then 1 tab for 2 days, then stop 20 tablet 0   No facility-administered medications prior to visit.     ROS Review of Systems  Constitutional: Negative.  Negative for chills, fatigue and fever.  HENT: Positive for congestion and sore throat. Negative for facial swelling, sinus pain, sinus pressure and trouble swallowing.   Eyes: Negative.   Respiratory: Positive for cough, shortness of breath and wheezing. Negative for stridor.   Cardiovascular: Negative.  Negative for chest pain, palpitations and leg swelling.  Gastrointestinal: Negative for abdominal pain, constipation, diarrhea, nausea and vomiting.  Endocrine: Negative.   Genitourinary: Negative.  Negative for difficulty urinating.  Musculoskeletal: Negative.  Negative for back pain.  Skin: Negative.  Negative for color change and rash.  Allergic/Immunologic: Negative.   Neurological: Negative.  Negative for dizziness, weakness and headaches.  Hematological: Negative for adenopathy. Does not bruise/bleed easily.   Psychiatric/Behavioral: Negative.     Objective:  BP 120/70 (BP Location: Left Arm, Patient Position: Sitting, Cuff Size: Normal)   Pulse (!) 55   Temp 98.3 F (36.8 C) (Oral)   Resp 16   Ht  (1.702 m)   Wt 160 lb (72.6 kg)   SpO2 100%   BMI 25.06 kg/m   BP Readings from Last 3 Encounters:  08/26/17 120/70  03/27/14 108/66  03/22/14 134/82    Wt Readings from Last 3 Encounters:  08/26/17 160 lb (72.6 kg)  03/27/14 154 lb 9.6 oz (70.1 kg)  03/22/14 156 lb 4 oz (70.9 kg)    Physical Exam  Constitutional: She is oriented to person, place, and time.  Non-toxic appearance. She does not have a sickly appearance. She does not appear ill. No distress.  HENT:  Mouth/Throat: Oropharynx is clear and moist. No oropharyngeal exudate.  Eyes: Conjunctivae are normal. Right eye exhibits no discharge. Left eye exhibits no discharge. No scleral icterus.  Neck: Normal range of motion. Neck supple. No JVD present. No thyromegaly present.  Cardiovascular: Normal rate, regular rhythm and intact distal pulses.  Exam reveals no gallop.   No murmur heard. Pulmonary/Chest: Effort normal. No accessory muscle usage. No tachypnea. No respiratory distress. She has no decreased breath sounds. She has wheezes in the right lower field. She has no rhonchi. She has no rales.  Inspirator wheeze in the right base  Abdominal: Soft. Bowel sounds are normal. She exhibits no distension and no mass. There is no tenderness. There is no rebound and no guarding.  Musculoskeletal: Normal range of motion. She  exhibits no edema, tenderness or deformity.  Neurological: She is alert and oriented to person, place, and time.  Skin: Skin is warm and dry. No rash noted. She is not diaphoretic. No erythema. No pallor.  Vitals reviewed.   Lab Results  Component Value Date   WBC 6.0 03/22/2014   HGB 13.2 03/22/2014   HCT 39.8 03/22/2014   PLT 249.0 03/22/2014   GLUCOSE 89 03/22/2014   CHOL 170 03/22/2014   TRIG  180.0 (H) 03/22/2014   HDL 50.00 03/22/2014   LDLCALC 84 03/22/2014   ALT 16 03/22/2014   AST 19 03/22/2014   NA 140 03/22/2014   K 4.1 03/22/2014   CL 105 03/22/2014   CREATININE 0.8 03/22/2014   BUN 10 03/22/2014   CO2 29 03/22/2014   TSH 1.61 03/22/2014    Mm Screening Breast Tomo Bilateral  Result Date: 10/27/2016 CLINICAL DATA:  Screening. EXAM: 2D DIGITAL SCREENING BILATERAL MAMMOGRAM WITH CAD AND ADJUNCT TOMO COMPARISON:  Previous exam(s). ACR Breast Density Category b: There are scattered areas of fibroglandular density. FINDINGS: There are no findings suspicious for malignancy. Images were processed with CAD. IMPRESSION: No mammographic evidence of malignancy. A result letter of this screening mammogram will be mailed directly to the patient. RECOMMENDATION: Screening mammogram in one year. (Code:SM-B-01Y) BI-RADS CATEGORY  1: Negative. Electronically Signed   By: Norva Pavlov M.D.   On: 10/28/2016 11:45    Assessment & Plan:   Yvette was seen today for cough and asthma.  Diagnoses and all orders for this visit:  Mild intermittent asthma with acute exacerbation - she's having a mild exacerbation so I asked her take a course of systemic methylprednisolone. She will continue to use Symbicort and albuterol as well. -     budesonide-formoterol (SYMBICORT) 160-4.5 MCG/ACT inhaler; 2 puffs and Rinse mouth twice daily -     methylPREDNISolone (MEDROL DOSEPAK) 4 MG TBPK tablet; TAKE AS DIRECTED  Viral URI with cough -     promethazine-dextromethorphan (PROMETHAZINE-DM) 6.25-15 MG/5ML syrup; Take 5 mLs by mouth 4 (four) times daily as needed for cough.  Cough- she has a mildly elevated FeNO score, will treat the asthma -     POCT EXHALED NITRIC OXIDE  Need for Tdap vaccination -     Tdap vaccine greater than or equal to 7yo IM   I have discontinued Ms. Viana's predniSONE. I am also having her start on promethazine-dextromethorphan and methylPREDNISolone. Additionally, I am  having her maintain her albuterol, NON FORMULARY, and budesonide-formoterol.  Meds ordered this encounter  Medications  . budesonide-formoterol (SYMBICORT) 160-4.5 MCG/ACT inhaler    Sig: 2 puffs and Rinse mouth twice daily    Dispense:  3 Inhaler    Refill:  1  . promethazine-dextromethorphan (PROMETHAZINE-DM) 6.25-15 MG/5ML syrup    Sig: Take 5 mLs by mouth 4 (four) times daily as needed for cough.    Dispense:  118 mL    Refill:  0  . methylPREDNISolone (MEDROL DOSEPAK) 4 MG TBPK tablet    Sig: TAKE AS DIRECTED    Dispense:  21 tablet    Refill:  0     Follow-up: Return in about 4 weeks (around 09/23/2017).  Sanda Linger, MD

## 2017-09-30 ENCOUNTER — Other Ambulatory Visit: Payer: Self-pay | Admitting: Internal Medicine

## 2017-09-30 DIAGNOSIS — Z139 Encounter for screening, unspecified: Secondary | ICD-10-CM

## 2017-10-28 ENCOUNTER — Ambulatory Visit
Admission: RE | Admit: 2017-10-28 | Discharge: 2017-10-28 | Disposition: A | Discharge status: Home / Self Care | Payer: BLUE CROSS/BLUE SHIELD | Source: Ambulatory Visit | Attending: Internal Medicine | Admitting: Internal Medicine

## 2017-10-28 DIAGNOSIS — Z139 Encounter for screening, unspecified: Secondary | ICD-10-CM

## 2017-10-28 DIAGNOSIS — Z1231 Encounter for screening mammogram for malignant neoplasm of breast: Secondary | ICD-10-CM | POA: Diagnosis not present

## 2017-10-28 LAB — HM MAMMOGRAPHY

## 2018-10-25 ENCOUNTER — Other Ambulatory Visit: Payer: Self-pay | Admitting: Internal Medicine

## 2018-10-25 DIAGNOSIS — Z1231 Encounter for screening mammogram for malignant neoplasm of breast: Secondary | ICD-10-CM

## 2018-11-04 ENCOUNTER — Ambulatory Visit (INDEPENDENT_AMBULATORY_CARE_PROVIDER_SITE_OTHER): Payer: BLUE CROSS/BLUE SHIELD

## 2018-11-04 DIAGNOSIS — Z23 Encounter for immunization: Secondary | ICD-10-CM | POA: Diagnosis not present

## 2018-11-04 DIAGNOSIS — Z299 Encounter for prophylactic measures, unspecified: Secondary | ICD-10-CM

## 2018-11-11 ENCOUNTER — Ambulatory Visit
Admission: RE | Admit: 2018-11-11 | Discharge: 2018-11-11 | Disposition: A | Discharge status: Home / Self Care | Payer: BLUE CROSS/BLUE SHIELD | Source: Ambulatory Visit | Attending: Internal Medicine | Admitting: Internal Medicine

## 2018-11-11 DIAGNOSIS — Z1231 Encounter for screening mammogram for malignant neoplasm of breast: Secondary | ICD-10-CM

## 2018-11-11 LAB — HM MAMMOGRAPHY

## 2018-12-02 ENCOUNTER — Ambulatory Visit (INDEPENDENT_AMBULATORY_CARE_PROVIDER_SITE_OTHER): Payer: BLUE CROSS/BLUE SHIELD

## 2018-12-02 DIAGNOSIS — Z23 Encounter for immunization: Secondary | ICD-10-CM | POA: Diagnosis not present

## 2018-12-13 ENCOUNTER — Encounter: Payer: Self-pay | Admitting: Internal Medicine

## 2019-03-02 ENCOUNTER — Telehealth: Payer: Self-pay

## 2019-03-02 NOTE — Telephone Encounter (Signed)
Copied from CRM [id number]. Topic: General - Inquiry >> Mar 02, 2019  9:44 AM Crist Infante wrote: Reason for CRM: pt wants to know if she should keep her second shingrix inj on 4/06? >> Mar 02, 2019 10:24 AM Marquis Buggy A wrote: Please advise, Thank you.   Patients first shingrix vaccine was in jan/2020---pt advised she has up to 6 months to get 2nd injection, would be safer to wait closer to July--pt agreed and pt will call back to reschedule nurse visit for 2nd shingrix vaccine

## 2019-03-07 ENCOUNTER — Ambulatory Visit: Payer: BLUE CROSS/BLUE SHIELD

## 2019-04-26 ENCOUNTER — Ambulatory Visit (INDEPENDENT_AMBULATORY_CARE_PROVIDER_SITE_OTHER): Payer: BLUE CROSS/BLUE SHIELD

## 2019-04-26 DIAGNOSIS — Z23 Encounter for immunization: Secondary | ICD-10-CM

## 2019-04-26 DIAGNOSIS — Z792 Long term (current) use of antibiotics: Secondary | ICD-10-CM

## 2019-07-06 ENCOUNTER — Encounter: Payer: Self-pay | Admitting: Internal Medicine

## 2019-07-07 ENCOUNTER — Other Ambulatory Visit (INDEPENDENT_AMBULATORY_CARE_PROVIDER_SITE_OTHER): Payer: BC Managed Care – PPO

## 2019-07-07 ENCOUNTER — Encounter: Payer: Self-pay | Admitting: Internal Medicine

## 2019-07-07 ENCOUNTER — Ambulatory Visit (INDEPENDENT_AMBULATORY_CARE_PROVIDER_SITE_OTHER): Payer: BC Managed Care – PPO | Admitting: Internal Medicine

## 2019-07-07 ENCOUNTER — Other Ambulatory Visit: Payer: Self-pay

## 2019-07-07 VITALS — BP 170/78 | HR 50 | Temp 98.1°F | Ht 67.0 in | Wt 142.5 lb

## 2019-07-07 DIAGNOSIS — R001 Bradycardia, unspecified: Secondary | ICD-10-CM

## 2019-07-07 DIAGNOSIS — J45998 Other asthma: Secondary | ICD-10-CM

## 2019-07-07 DIAGNOSIS — Z1159 Encounter for screening for other viral diseases: Secondary | ICD-10-CM | POA: Diagnosis not present

## 2019-07-07 DIAGNOSIS — Z0000 Encounter for general adult medical examination without abnormal findings: Secondary | ICD-10-CM | POA: Diagnosis not present

## 2019-07-07 DIAGNOSIS — I10 Essential (primary) hypertension: Secondary | ICD-10-CM | POA: Insufficient documentation

## 2019-07-07 LAB — LIPID PANEL
Cholesterol: 193 mg/dL (ref 0–200)
HDL: 53.8 mg/dL (ref >39.00)
LDL Cholesterol: 100 mg/dL — ABNORMAL HIGH (ref 0–99)
NonHDL: 139.39
Total CHOL/HDL Ratio: 4
Triglycerides: 199 mg/dL — ABNORMAL HIGH (ref 0.0–149.0)
VLDL: 39.8 mg/dL (ref 0.0–40.0)

## 2019-07-07 LAB — URINALYSIS, ROUTINE W REFLEX MICROSCOPIC
Bilirubin Urine: NEGATIVE
Hgb urine dipstick: NEGATIVE
Ketones, ur: NEGATIVE
Leukocytes,Ua: NEGATIVE
Nitrite: NEGATIVE
RBC / HPF: NONE SEEN (ref 0–?)
Specific Gravity, Urine: 1.02 (ref 1.000–1.030)
Total Protein, Urine: NEGATIVE
Urine Glucose: NEGATIVE
Urobilinogen, UA: 0.2 (ref 0.0–1.0)
pH: 6 (ref 5.0–8.0)

## 2019-07-07 LAB — CBC WITH DIFFERENTIAL/PLATELET
Basophils Absolute: 0.1 10*3/uL (ref 0.0–0.1)
Basophils Relative: 1 % (ref 0.0–3.0)
Eosinophils Absolute: 0.2 10*3/uL (ref 0.0–0.7)
Eosinophils Relative: 2.5 % (ref 0.0–5.0)
HCT: 41.9 % (ref 36.0–46.0)
Hemoglobin: 13.9 g/dL (ref 12.0–15.0)
Lymphocytes Relative: 23.2 % (ref 12.0–46.0)
Lymphs Abs: 1.6 10*3/uL (ref 0.7–4.0)
MCHC: 33.1 g/dL (ref 30.0–36.0)
MCV: 90 fl (ref 78.0–100.0)
Monocytes Absolute: 0.5 10*3/uL (ref 0.1–1.0)
Monocytes Relative: 7 % (ref 3.0–12.0)
Neutro Abs: 4.4 10*3/uL (ref 1.4–7.7)
Neutrophils Relative %: 66.3 % (ref 43.0–77.0)
Platelets: 214 10*3/uL (ref 150.0–400.0)
RBC: 4.65 Mil/uL (ref 3.87–5.11)
RDW: 13.1 % (ref 11.5–15.5)
WBC: 6.7 10*3/uL (ref 4.0–10.5)

## 2019-07-07 LAB — BASIC METABOLIC PANEL
BUN: 13 mg/dL (ref 6–23)
CO2: 28 mEq/L (ref 19–32)
Calcium: 9.9 mg/dL (ref 8.4–10.5)
Chloride: 102 mEq/L (ref 96–112)
Creatinine, Ser: 0.8 mg/dL (ref 0.40–1.20)
GFR: 72.38 mL/min (ref >60.00)
Glucose, Bld: 103 mg/dL — ABNORMAL HIGH (ref 70–99)
Potassium: 4.1 mEq/L (ref 3.5–5.1)
Sodium: 139 mEq/L (ref 135–145)

## 2019-07-07 LAB — HEPATIC FUNCTION PANEL
ALT: 12 U/L (ref 0–35)
AST: 14 U/L (ref 0–37)
Albumin: 4.7 g/dL (ref 3.5–5.2)
Alkaline Phosphatase: 90 U/L (ref 39–117)
Bilirubin, Direct: 0 mg/dL (ref 0.0–0.3)
Total Bilirubin: 0.3 mg/dL (ref 0.2–1.2)
Total Protein: 8 g/dL (ref 6.0–8.3)

## 2019-07-07 LAB — VITAMIN D 25 HYDROXY (VIT D DEFICIENCY, FRACTURES): VITD: 34.3 ng/mL (ref 30.00–100.00)

## 2019-07-07 LAB — TSH: TSH: 2.14 u[IU]/mL (ref 0.35–4.50)

## 2019-07-07 MED ORDER — INDAPAMIDE 1.25 MG PO TABS: 1.2500 mg | ORAL_TABLET | Freq: Every day | ORAL | 0 refills | Status: DC

## 2019-07-07 NOTE — Progress Notes (Signed)
Subjective:  Patient ID: Yvette Walton, female    DOB: 1956/10/06  Age: 63 y.o. MRN: [id number]  CC: Annual Exam and Hypertension   HPI Yvette Walton presents for a CPX.  She is not willing to do a Pap smear today.  She tells me she has felt well recently.  She does not monitor her blood pressure.  She is active and denies any recent episodes of palpitations, dizziness, lightheadedness, near syncope, CP, S OB, or DOE.   Outpatient Medications Prior to Visit  Medication Sig Dispense Refill  . budesonide-formoterol (SYMBICORT) 160-4.5 MCG/ACT inhaler 2 puffs and Rinse mouth twice daily 3 Inhaler 1  . methylPREDNISolone (MEDROL DOSEPAK) 4 MG TBPK tablet TAKE AS DIRECTED 21 tablet 0  . NON FORMULARY Green Miracle-Take 6 capsules daily    . promethazine-dextromethorphan (PROMETHAZINE-DM) 6.25-15 MG/5ML syrup Take 5 mLs by mouth 4 (four) times daily as needed for cough. 118 mL 0  . albuterol (PROVENTIL HFA) 108 (90 BASE) MCG/ACT inhaler Inhale 2 puffs into the lungs every 6 (six) hours as needed for wheezing or shortness of breath. (Patient not taking: Reported on 07/07/2019) 1 Inhaler prn   No facility-administered medications prior to visit.     ROS Review of Systems  Constitutional: Negative.  Negative for appetite change, diaphoresis, fatigue and unexpected weight change.  HENT: Negative.   Eyes: Negative.   Respiratory: Negative for cough, chest tightness, shortness of breath and wheezing.   Cardiovascular: Negative for chest pain, palpitations and leg swelling.  Gastrointestinal: Negative for abdominal pain, diarrhea, nausea and vomiting.  Endocrine: Negative.  Negative for cold intolerance and heat intolerance.  Genitourinary: Negative.   Musculoskeletal: Negative.  Negative for arthralgias and myalgias.  Skin: Negative.  Negative for color change, pallor and rash.  Neurological: Negative.  Negative for dizziness, syncope, weakness and light-headedness.  Hematological: Negative for  adenopathy. Does not bruise/bleed easily.  Psychiatric/Behavioral: Negative.     Objective:  BP (!) 170/78 (BP Location: Left Arm, Patient Position: Sitting, Cuff Size: Normal)   Pulse (!) 50   Temp 98.1 F (36.7 C) (Oral)   Ht 5\' 7"  (1.702 m)   Wt 142 lb 8 oz (64.6 kg)   SpO2 98%   BMI 22.32 kg/m   BP Readings from Last 3 Encounters:  07/07/19 (!) 170/78  08/26/17 120/70  03/27/14 108/66    Wt Readings from Last 3 Encounters:  07/07/19 142 lb 8 oz (64.6 kg)  08/26/17 160 lb (72.6 kg)  03/27/14 154 lb 9.6 oz (70.1 kg)    Physical Exam Vitals signs reviewed.  HENT:     Nose: Nose normal.     Mouth/Throat:     Mouth: Mucous membranes are moist.     Pharynx: No oropharyngeal exudate or posterior oropharyngeal erythema.  Eyes:     General: No scleral icterus.    Conjunctiva/sclera: Conjunctivae normal.  Neck:     Musculoskeletal: Normal range of motion. No neck rigidity.  Cardiovascular:     Rate and Rhythm: Regular rhythm. Bradycardia present.     Heart sounds: Normal heart sounds, S1 normal and S2 normal. No murmur. No gallop.      Comments: EKG ---  Marked sinus  Bradycardia  Low voltage in limb leads.   ABNORMAL - Her heart rate is 44 today.  Her heart rate was in the 50s years ago. Pulmonary:     Effort: Pulmonary effort is normal.     Breath sounds: No stridor. No wheezing, rhonchi or rales.  Abdominal:     General: Abdomen is flat. Bowel sounds are normal. There is no distension.     Palpations: There is no hepatomegaly or splenomegaly.     Tenderness: There is no abdominal tenderness.  Musculoskeletal:     Right lower leg: No edema.     Left lower leg: No edema.  Skin:    General: Skin is warm.     Coloration: Skin is not pale.  Neurological:     General: No focal deficit present.     Mental Status: She is oriented to person, place, and time. Mental status is at baseline.  Psychiatric:        Mood and Affect: Mood normal.        Behavior: Behavior  normal.     Lab Results  Component Value Date   WBC 6.7 07/07/2019   HGB 13.9 07/07/2019   HCT 41.9 07/07/2019   PLT 214.0 07/07/2019   GLUCOSE 103 (H) 07/07/2019   CHOL 193 07/07/2019   TRIG 199.0 (H) 07/07/2019   HDL 53.80 07/07/2019   LDLCALC 100 (H) 07/07/2019   ALT 12 07/07/2019   AST 14 07/07/2019   NA 139 07/07/2019   K 4.1 07/07/2019   CL 102 07/07/2019   CREATININE 0.80 07/07/2019   BUN 13 07/07/2019   CO2 28 07/07/2019   TSH 2.14 07/07/2019    Mm 3d Screen Breast Bilateral  Result Date: 11/11/2018 CLINICAL DATA:  Screening. EXAM: DIGITAL SCREENING BILATERAL MAMMOGRAM WITH TOMO AND CAD COMPARISON:  Previous exam(s). ACR Breast Density Category b: There are scattered areas of fibroglandular density. FINDINGS: There are no findings suspicious for malignancy. Images were processed with CAD. IMPRESSION: No mammographic evidence of malignancy. A result letter of this screening mammogram will be mailed directly to the patient. RECOMMENDATION: Screening mammogram in one year. (Code:SM-B-01Y) BI-RADS CATEGORY  1: Negative. Electronically Signed   By: Frederico HammanMichelle  Collins M.D.   On: 11/11/2018 13:58    Assessment & Plan:   Yvette Walton was seen today for annual exam and hypertension.  Diagnoses and all orders for this visit:  Routine general medical examination at a health care facility- Exam completed, labs reviewed, vaccines reviewed, colon cancer screening is up-to-date, mammogram is up-to-date, she will come back at a later date for screening Pap smear, patient education was given. -     Lipid panel; Future -     HIV Antibody (routine testing w rflx); Future  Need for hepatitis C screening test -     Hepatitis C antibody; Future  Allergic-infective asthma- She has not recently treated this and does not have any symptoms.  This has apparently resolved.  Bradycardia- She is asymptomatic with this.  Labs are negative for secondary causes.  No intervention needed at this time.  -     TSH; Future  Essential hypertension- She has developed systolic hypertension.  Her EKG is negative for LVH or ischemia.  Her labs are negative for secondary causes or endorgan damage.  I have asked her to start taking a thiazide diuretic to treat this. -     TSH; Future -     Urinalysis, Routine w reflex microscopic; Future -     CBC with Differential/Platelet; Future -     Hepatic function panel; Future -     VITAMIN D 25 Hydroxy (Vit-D Deficiency, Fractures); Future -     Basic metabolic panel; Future -     EKG 12-Lead -     indapamide (LOZOL) 1.25 MG  tablet; Take 1 tablet (1.25 mg total) by mouth daily.   I have discontinued Yvette Walton Walsworth "Gail"'s NON FORMULARY, budesonide-formoterol, promethazine-dextromethorphan, and methylPREDNISolone. I am also having her start on indapamide. Additionally, I am having her maintain her albuterol.  Meds ordered this encounter  Medications  . indapamide (LOZOL) 1.25 MG tablet    Sig: Take 1 tablet (1.25 mg total) by mouth daily.    Dispense:  90 tablet    Refill:  0     Follow-up: Return in about 4 weeks (around 08/04/2019).  Sanda Lingerhomas Classie Weng, MD

## 2019-07-07 NOTE — Patient Instructions (Signed)

## 2019-07-08 LAB — HEPATITIS C ANTIBODY
Hepatitis C Ab: NONREACTIVE
SIGNAL TO CUT-OFF: 0.01 (ref <1.00)

## 2019-07-08 LAB — HIV ANTIBODY (ROUTINE TESTING W REFLEX): HIV 1&2 Ab, 4th Generation: NONREACTIVE

## 2019-07-09 ENCOUNTER — Encounter: Payer: Self-pay | Admitting: Internal Medicine

## 2019-07-13 ENCOUNTER — Encounter: Payer: Self-pay | Admitting: Internal Medicine

## 2019-07-18 ENCOUNTER — Encounter: Payer: Self-pay | Admitting: Internal Medicine

## 2019-07-18 ENCOUNTER — Ambulatory Visit (INDEPENDENT_AMBULATORY_CARE_PROVIDER_SITE_OTHER): Payer: BC Managed Care – PPO | Admitting: Internal Medicine

## 2019-07-18 ENCOUNTER — Other Ambulatory Visit (INDEPENDENT_AMBULATORY_CARE_PROVIDER_SITE_OTHER): Payer: BC Managed Care – PPO

## 2019-07-18 ENCOUNTER — Other Ambulatory Visit (HOSPITAL_COMMUNITY)
Admission: RE | Admit: 2019-07-18 | Discharge: 2019-07-18 | Disposition: A | Discharge status: Home / Self Care | Payer: BC Managed Care – PPO | Source: Ambulatory Visit | Attending: Internal Medicine | Admitting: Internal Medicine

## 2019-07-18 ENCOUNTER — Other Ambulatory Visit: Payer: Self-pay

## 2019-07-18 VITALS — BP 152/84 | HR 52 | Temp 98.0°F | Resp 16 | Ht 67.0 in | Wt 140.8 lb

## 2019-07-18 DIAGNOSIS — Z124 Encounter for screening for malignant neoplasm of cervix: Secondary | ICD-10-CM

## 2019-07-18 DIAGNOSIS — I10 Essential (primary) hypertension: Secondary | ICD-10-CM | POA: Diagnosis not present

## 2019-07-18 DIAGNOSIS — T502X5A Adverse effect of carbonic-anhydrase inhibitors, benzothiadiazides and other diuretics, initial encounter: Secondary | ICD-10-CM

## 2019-07-18 DIAGNOSIS — E876 Hypokalemia: Secondary | ICD-10-CM | POA: Diagnosis not present

## 2019-07-18 LAB — BASIC METABOLIC PANEL
BUN: 18 mg/dL (ref 6–23)
CO2: 33 mEq/L — ABNORMAL HIGH (ref 19–32)
Calcium: 9.7 mg/dL (ref 8.4–10.5)
Chloride: 99 mEq/L (ref 96–112)
Creatinine, Ser: 0.85 mg/dL (ref 0.40–1.20)
GFR: 67.48 mL/min (ref >60.00)
Glucose, Bld: 100 mg/dL — ABNORMAL HIGH (ref 70–99)
Potassium: 3.3 mEq/L — ABNORMAL LOW (ref 3.5–5.1)
Sodium: 139 mEq/L (ref 135–145)

## 2019-07-18 LAB — HM PAP SMEAR

## 2019-07-18 MED ORDER — POTASSIUM CHLORIDE CRYS ER 20 MEQ PO TBCR: 20.0000 meq | EXTENDED_RELEASE_TABLET | Freq: Two times a day (BID) | ORAL | 0 refills | Status: DC

## 2019-07-18 NOTE — Progress Notes (Signed)
Subjective:  Patient ID: Yvette Walton, female    DOB: September 01, 1956  Age: 63 y.o. MRN: [id number]  CC: Hypertension   HPI Yvette Walton presents for f/up -she tells me her blood pressure is improving.  She is tolerating the thiazide diuretic well.  She denies any recent episodes of headache, blurred vision, chest pain, shortness of breath, dizziness, or lightheadedness.  She wants to get her Pap done today.  Outpatient Medications Prior to Visit  Medication Sig Dispense Refill  . albuterol (PROVENTIL HFA) 108 (90 BASE) MCG/ACT inhaler Inhale 2 puffs into the lungs every 6 (six) hours as needed for wheezing or shortness of breath. 1 Inhaler prn  . indapamide (LOZOL) 1.25 MG tablet Take 1 tablet (1.25 mg total) by mouth daily. 90 tablet 0   No facility-administered medications prior to visit.     ROS Review of Systems  Constitutional: Negative.  Negative for appetite change, diaphoresis, fatigue and unexpected weight change.  HENT: Negative.   Eyes: Negative for visual disturbance.  Respiratory: Negative for cough, chest tightness, shortness of breath and wheezing.   Cardiovascular: Negative for chest pain, palpitations and leg swelling.  Gastrointestinal: Negative for abdominal pain, constipation, diarrhea, nausea and vomiting.  Endocrine: Negative.   Genitourinary: Negative.  Negative for difficulty urinating, dysuria, menstrual problem, pelvic pain, vaginal bleeding and vaginal discharge.  Musculoskeletal: Negative.  Negative for arthralgias and myalgias.  Skin: Negative.  Negative for color change and pallor.  Neurological: Negative.  Negative for dizziness, weakness, light-headedness and headaches.  Hematological: Negative for adenopathy. Does not bruise/bleed easily.  Psychiatric/Behavioral: Negative.     Objective:  BP (!) 152/84 (BP Location: Left Arm, Patient Position: Sitting, Cuff Size: Normal)   Pulse (!) 52   Temp 98 F (36.7 C) (Oral)   Resp 16   Ht 5\' 7"  (1.702 m)    Wt 140 lb 12 oz (63.8 kg)   SpO2 98%   BMI 22.04 kg/m   BP Readings from Last 3 Encounters:  07/18/19 (!) 152/84  07/07/19 (!) 170/78  08/26/17 120/70    Wt Readings from Last 3 Encounters:  07/18/19 140 lb 12 oz (63.8 kg)  07/07/19 142 lb 8 oz (64.6 kg)  08/26/17 160 lb (72.6 kg)    Physical Exam Vitals signs reviewed. Exam conducted with a chaperone present.  HENT:     Nose: Nose normal.     Mouth/Throat:     Mouth: Mucous membranes are moist.  Eyes:     General: No scleral icterus.    Conjunctiva/sclera: Conjunctivae normal.  Neck:     Musculoskeletal: Normal range of motion. No neck rigidity.  Cardiovascular:     Rate and Rhythm: Normal rate and regular rhythm.     Heart sounds: No murmur.  Pulmonary:     Effort: Pulmonary effort is normal. No respiratory distress.     Breath sounds: No stridor. No wheezing, rhonchi or rales.  Abdominal:     General: Abdomen is flat. Bowel sounds are normal.     Palpations: There is no hepatomegaly or splenomegaly.     Tenderness: There is no abdominal tenderness.     Hernia: There is no hernia in the left inguinal area or right inguinal area.  Genitourinary:    Exam position: Supine.     Pubic Area: No rash or pubic lice.      Labia:        Right: No rash, tenderness, lesion or injury.        Left:  No tenderness, lesion or injury.      Urethra: No prolapse, urethral pain, urethral swelling or urethral lesion.     Vagina: Normal. No signs of injury and foreign body. No vaginal discharge, erythema, tenderness, bleeding, lesions or prolapsed vaginal walls.     Cervix: No cervical motion tenderness, discharge, friability, cervical bleeding or eversion.     Uterus: Normal. Not enlarged and not tender.      Adnexa: Right adnexa normal and left adnexa normal.       Right: No mass, tenderness or fullness.         Left: No mass, tenderness or fullness.       Rectum: Normal. Guaiac result negative. No mass, tenderness, anal fissure,  external hemorrhoid or internal hemorrhoid. Normal anal tone.  Musculoskeletal: Normal range of motion.     Right lower leg: No edema.     Left lower leg: No edema.  Lymphadenopathy:     Cervical: No cervical adenopathy.     Lower Body: No right inguinal adenopathy. No left inguinal adenopathy.  Skin:    General: Skin is warm and dry.     Coloration: Skin is not pale.  Neurological:     General: No focal deficit present.     Mental Status: She is oriented to person, place, and time. Mental status is at baseline.     Lab Results  Component Value Date   WBC 6.7 07/07/2019   HGB 13.9 07/07/2019   HCT 41.9 07/07/2019   PLT 214.0 07/07/2019   GLUCOSE 100 (H) 07/18/2019   CHOL 193 07/07/2019   TRIG 199.0 (H) 07/07/2019   HDL 53.80 07/07/2019   LDLCALC 100 (H) 07/07/2019   ALT 12 07/07/2019   AST 14 07/07/2019   NA 139 07/18/2019   K 3.3 (L) 07/18/2019   CL 99 07/18/2019   CREATININE 0.85 07/18/2019   BUN 18 07/18/2019   CO2 33 (H) 07/18/2019   TSH 2.14 07/07/2019    Mm 3d Screen Breast Bilateral  Result Date: 11/11/2018 CLINICAL DATA:  Screening. EXAM: DIGITAL SCREENING BILATERAL MAMMOGRAM WITH TOMO AND CAD COMPARISON:  Previous exam(s). ACR Breast Density Category b: There are scattered areas of fibroglandular density. FINDINGS: There are no findings suspicious for malignancy. Images were processed with CAD. IMPRESSION: No mammographic evidence of malignancy. A result letter of this screening mammogram will be mailed directly to the patient. RECOMMENDATION: Screening mammogram in one year. (Code:SM-B-01Y) BI-RADS CATEGORY  1: Negative. Electronically Signed   By: Frederico HammanMichelle  Collins M.D.   On: 11/11/2018 13:58    Assessment & Plan:   Yvette was seen today for hypertension.  Diagnoses and all orders for this visit:  Essential hypertension- Her blood pressure is improving.  She has developed hypokalemia.  She will continue to work on lifestyle modifications and I recommended  she stay on the current dose of the thiazide diuretic. -     Basic metabolic panel; Future -     potassium chloride SA (K-DUR) 20 MEQ tablet; Take 1 tablet (20 mEq total) by mouth 2 (two) times daily.  Cervical cancer screening -     Cytology - PAP; Future  Diuretic-induced hypokalemia -     potassium chloride SA (K-DUR) 20 MEQ tablet; Take 1 tablet (20 mEq total) by mouth 2 (two) times daily.   I am having Yvette Walton "Dondra SpryGail" start on potassium chloride SA. I am also having her maintain her albuterol and indapamide.  Meds ordered this encounter  Medications  .  potassium chloride SA (K-DUR) 20 MEQ tablet    Sig: Take 1 tablet (20 mEq total) by mouth 2 (two) times daily.    Dispense:  180 tablet    Refill:  0     Follow-up: Return in about 3 months (around 10/18/2019).  Scarlette Calico, MD

## 2019-07-18 NOTE — Patient Instructions (Signed)

## 2019-07-19 ENCOUNTER — Encounter: Payer: Self-pay | Admitting: Internal Medicine

## 2019-07-19 LAB — CYTOLOGY - PAP
Chlamydia: NEGATIVE
Diagnosis: NEGATIVE
HPV: NOT DETECTED
Neisseria Gonorrhea: NEGATIVE

## 2019-08-17 ENCOUNTER — Encounter: Payer: Self-pay | Admitting: Internal Medicine

## 2019-08-22 ENCOUNTER — Other Ambulatory Visit: Payer: Self-pay | Admitting: Internal Medicine

## 2019-08-22 DIAGNOSIS — T502X5A Adverse effect of carbonic-anhydrase inhibitors, benzothiadiazides and other diuretics, initial encounter: Secondary | ICD-10-CM

## 2019-08-22 DIAGNOSIS — I10 Essential (primary) hypertension: Secondary | ICD-10-CM

## 2019-08-22 DIAGNOSIS — E876 Hypokalemia: Secondary | ICD-10-CM

## 2019-08-22 MED ORDER — POTASSIUM CHLORIDE ER 8 MEQ PO TBCR: 8.0000 meq | EXTENDED_RELEASE_TABLET | Freq: Three times a day (TID) | ORAL | 3 refills | Status: DC

## 2019-09-28 ENCOUNTER — Other Ambulatory Visit: Payer: Self-pay | Admitting: Internal Medicine

## 2019-09-28 DIAGNOSIS — I10 Essential (primary) hypertension: Secondary | ICD-10-CM

## 2019-10-18 ENCOUNTER — Other Ambulatory Visit: Payer: Self-pay | Admitting: Internal Medicine

## 2019-10-18 DIAGNOSIS — Z1231 Encounter for screening mammogram for malignant neoplasm of breast: Secondary | ICD-10-CM

## 2019-12-12 ENCOUNTER — Other Ambulatory Visit: Payer: Self-pay

## 2019-12-12 ENCOUNTER — Ambulatory Visit
Admission: RE | Admit: 2019-12-12 | Discharge: 2019-12-12 | Disposition: A | Discharge status: Home / Self Care | Payer: BC Managed Care – PPO | Source: Ambulatory Visit | Attending: Internal Medicine | Admitting: Internal Medicine

## 2019-12-12 DIAGNOSIS — Z1231 Encounter for screening mammogram for malignant neoplasm of breast: Secondary | ICD-10-CM | POA: Diagnosis not present

## 2019-12-12 LAB — HM MAMMOGRAPHY

## 2019-12-20 ENCOUNTER — Other Ambulatory Visit: Payer: Self-pay | Admitting: Internal Medicine

## 2019-12-20 DIAGNOSIS — I10 Essential (primary) hypertension: Secondary | ICD-10-CM

## 2019-12-20 DIAGNOSIS — T502X5A Adverse effect of carbonic-anhydrase inhibitors, benzothiadiazides and other diuretics, initial encounter: Secondary | ICD-10-CM

## 2019-12-20 DIAGNOSIS — E876 Hypokalemia: Secondary | ICD-10-CM

## 2020-01-30 ENCOUNTER — Telehealth: Payer: Self-pay | Admitting: Internal Medicine

## 2020-01-30 NOTE — Telephone Encounter (Signed)
Patient contacted and has been scheduled for virtual visit with Dr. Okey Dupre.

## 2020-01-30 NOTE — Telephone Encounter (Signed)
New message:   Pt is calling and states she is having chest congestion and coughing. She states she has been taking mucinex and would like to know if there are some other things she should possible take to help. Please advise.

## 2020-01-31 ENCOUNTER — Encounter: Payer: Self-pay | Admitting: Internal Medicine

## 2020-01-31 ENCOUNTER — Ambulatory Visit (INDEPENDENT_AMBULATORY_CARE_PROVIDER_SITE_OTHER): Payer: BC Managed Care – PPO | Admitting: Internal Medicine

## 2020-01-31 DIAGNOSIS — R05 Cough: Secondary | ICD-10-CM

## 2020-01-31 DIAGNOSIS — R059 Cough, unspecified: Secondary | ICD-10-CM | POA: Insufficient documentation

## 2020-01-31 MED ORDER — BENZONATATE 200 MG PO CAPS: 200.0000 mg | ORAL_CAPSULE | Freq: Three times a day (TID) | ORAL | 0 refills | Status: DC | PRN

## 2020-01-31 MED ORDER — FLUTICASONE PROPIONATE 50 MCG/ACT NA SUSP: 2.0000 | Freq: Every day | NASAL | 6 refills | Status: DC

## 2020-01-31 NOTE — Progress Notes (Signed)
Virtual Visit via Video Note  I connected with Yvette Walton on 01/31/20 at  9:40 AM EST by a video enabled telemedicine application and verified that I am speaking with the correct person using two identifiers.  The patient and the provider were at separate locations throughout the entire encounter.   I discussed the limitations of evaluation and management by telemedicine and the availability of in person appointments. The patient expressed understanding and agreed to proceed. The patient and the provider were the only parties present for the visit unless noted in HPI below.  History of Present Illness: The patient is a 64 y.o. female with visit for cough and sinus congestion. Started about 5 days ago. Has been taking dayquil which has not helped much. She is having drainage and some congestion in her chest. Mostly non-productive cough. She does have allergies and asthma. Denies SOB or using albuterol inhaler recently. Denies fevers or chills. Denies body aches. Denies loss of taste/smell. Denies exposure to covid-19 and is very careful not to go anywhere or be around anyone. She is concerned as she is prone to bronchitis. She has not tried any allergy medication recently and does not take it regularly. Overall it is not improving but not worsening.   Observations/Objective: Appearance: normal, breathing appears normal, casual grooming, abdomen does not appear distended, throat with clear drainage, memory normal, mental status is A and O times 3  Assessment and Plan: See problem oriented charting  Follow Up Instructions: offered covid-19 testing, rx flonase and tessalon perles, she will check in with [place] in 2-3 days on condition, no indication for antibiotics today  I discussed the assessment and treatment plan with the patient. The patient was provided an opportunity to ask questions and all were answered. The patient agreed with the plan and demonstrated an understanding of the instructions.    The patient was advised to call back or seek an in-person evaluation if the symptoms worsen or if the condition fails to improve as anticipated.  Myrlene Broker, MD

## 2020-01-31 NOTE — Assessment & Plan Note (Signed)
Offered covid-19 testing but patient does not feel at risk for this. Advised to start flonase which was prescribed as well as tessalon perles for cough if needed. Advised to start taking allergy medicine otc if she has this. Let us know in 2-3 days how she is doing. No indication for antibiotics currently.

## 2020-02-02 ENCOUNTER — Telehealth: Payer: Self-pay | Admitting: Internal Medicine

## 2020-02-02 NOTE — Telephone Encounter (Signed)
Ok to forward to Dr Okey Dupre who saw pt

## 2020-02-02 NOTE — Telephone Encounter (Signed)
Spoke with patient today and info given. 

## 2020-02-02 NOTE — Telephone Encounter (Signed)
New Message:   Pt states she was prescribed benzonatate (TESSALON) 200 MG capsule for a cough and she states it makes her so drowsy she can't work. Pt would like to know if there is anything else she can be prescribed. She states this medicine was prescribed by Dr. Okey Dupre.

## 2020-02-02 NOTE — Telephone Encounter (Signed)
Can try delsym otc. Tessalon perles should not her drowsy and most cough syrups which are prescription have a much higher risk of drowsiness.

## 2020-02-06 ENCOUNTER — Telehealth: Payer: Self-pay | Admitting: Internal Medicine

## 2020-02-06 NOTE — Telephone Encounter (Signed)
Pt has been informed.

## 2020-02-06 NOTE — Telephone Encounter (Signed)
Would recommend if cough not improving needs covid-19 testing.

## 2020-02-06 NOTE — Telephone Encounter (Signed)
Request likely more appropriate to prescribing MD or PCP

## 2020-02-06 NOTE — Telephone Encounter (Signed)
New message:    Pt calls and states she had a virtual appt last week and medication was prescribed for her cough. She states this medication is not working. Please advise.

## 2020-02-07 DIAGNOSIS — R05 Cough: Secondary | ICD-10-CM | POA: Diagnosis not present

## 2020-02-10 ENCOUNTER — Encounter: Payer: Self-pay | Admitting: Internal Medicine

## 2020-02-18 ENCOUNTER — Other Ambulatory Visit: Payer: Self-pay | Admitting: Internal Medicine

## 2020-02-18 DIAGNOSIS — T502X5A Adverse effect of carbonic-anhydrase inhibitors, benzothiadiazides and other diuretics, initial encounter: Secondary | ICD-10-CM

## 2020-02-18 DIAGNOSIS — E876 Hypokalemia: Secondary | ICD-10-CM

## 2020-02-18 DIAGNOSIS — I10 Essential (primary) hypertension: Secondary | ICD-10-CM

## 2020-02-20 NOTE — Progress Notes (Signed)
Virtual Visit via Video Note  I connected with Yvette Walton on 02/21/20 at 10:30 AM EDT by a video enabled telemedicine application and verified that I am speaking with the correct person using two identifiers.   I discussed the limitations of evaluation and management by telemedicine and the availability of in person appointments. The patient expressed understanding and agreed to proceed.  Present for the visit:  Myself, Dr Cheryll Cockayne, Yvette Walton.  The patient is currently at home and I am in the office.    No referring provider.    History of Present Illness: She is here for follow up of her chronic medical conditions - hypertension.   She tested positive for covid on 3/11.  She had a mild illness and has recovered.   Hypertension: She is taking her medication daily. She is compliant with a low sodium diet.  She denies chest pain, palpitations, edema, shortness of breath and regular headaches. She does monitor her blood pressure at home.  BP 143/79    Review of Systems  Constitutional: Negative for fever.  Respiratory: Negative for shortness of breath.   Cardiovascular: Negative for chest pain, palpitations and leg swelling.  Neurological: Negative for dizziness and headaches.      Social History   Socioeconomic History  . Marital status: Single    Spouse name: Not on file  . Number of children: Not on file  . Years of education: Not on file  . Highest education level: Not on file  Occupational History  . Occupation: Works full time    Associate Professor: [employer]  Tobacco Use  . Smoking status: Never Smoker  . Smokeless tobacco: Never Used  . Tobacco comment: occasional smoker 20+ years ago. Social smoker.  Substance and Sexual Activity  . Alcohol use: Yes    Alcohol/week: 2.0 standard drinks    Types: 2 Glasses of wine per week  . Drug use: No  . Sexual activity: Not Currently  Other Topics Concern  . Not on file  Social History Narrative  . Not on file    Social Determinants of Health   Financial Resource Strain:   . Difficulty of Paying Living Expenses:   Food Insecurity:   . Worried About Programme researcher, broadcasting/film/video in the Last Year:   . Barista in the Last Year:   Transportation Needs:   . Freight forwarder (Medical):   Marland Kitchen Lack of Transportation (Non-Medical):   Physical Activity:   . Days of Exercise per Week:   . Minutes of Exercise per Session:   Stress:   . Feeling of Stress :   Social Connections:   . Frequency of Communication with Friends and Family:   . Frequency of Social Gatherings with Friends and Family:   . Attends Religious Services:   . Active Member of Clubs or Organizations:   . Attends Banker Meetings:   Marland Kitchen Marital Status:      Observations/Objective: Appears well in NAD Breathing normally Skin appears warm and dry  Assessment and Plan:   Hypertension: Chronic BP controlled at home Current regimen effective and well tolerated Continue current medications at current doses Refill sent to pharmacy   Hypokalemia: Secondary to diuretic Potassium refill sent to pharmacy   See Problem List for Assessment and Plan of chronic medical problems.   Follow Up Instructions:    I discussed the assessment and treatment plan with the patient. The patient was provided an opportunity to ask  questions and all were answered. The patient agreed with the plan and demonstrated an understanding of the instructions.   The patient was advised to call back or seek an in-person evaluation if the symptoms worsen or if the condition fails to improve as anticipated.    Binnie Rail, MD

## 2020-02-21 ENCOUNTER — Encounter: Payer: Self-pay | Admitting: Internal Medicine

## 2020-02-21 ENCOUNTER — Ambulatory Visit (INDEPENDENT_AMBULATORY_CARE_PROVIDER_SITE_OTHER): Payer: BC Managed Care – PPO | Admitting: Internal Medicine

## 2020-02-21 DIAGNOSIS — T502X5A Adverse effect of carbonic-anhydrase inhibitors, benzothiadiazides and other diuretics, initial encounter: Secondary | ICD-10-CM

## 2020-02-21 DIAGNOSIS — I10 Essential (primary) hypertension: Secondary | ICD-10-CM | POA: Diagnosis not present

## 2020-02-21 DIAGNOSIS — E876 Hypokalemia: Secondary | ICD-10-CM | POA: Diagnosis not present

## 2020-02-21 MED ORDER — POTASSIUM CHLORIDE ER 8 MEQ PO TBCR: 8.0000 meq | EXTENDED_RELEASE_TABLET | Freq: Three times a day (TID) | ORAL | 1 refills | Status: DC

## 2020-02-21 MED ORDER — INDAPAMIDE 1.25 MG PO TABS: 1.2500 mg | ORAL_TABLET | Freq: Every day | ORAL | 1 refills | Status: DC

## 2020-04-13 ENCOUNTER — Telehealth: Payer: Self-pay | Admitting: Internal Medicine

## 2020-04-13 DIAGNOSIS — E876 Hypokalemia: Secondary | ICD-10-CM

## 2020-04-13 DIAGNOSIS — I10 Essential (primary) hypertension: Secondary | ICD-10-CM

## 2020-04-17 NOTE — Telephone Encounter (Signed)
New message:   Pt is calling and wants to know why her Potassium medication is not being filled. Please advise.

## 2020-04-18 MED ORDER — POTASSIUM CHLORIDE ER 8 MEQ PO TBCR: 8.0000 meq | EXTENDED_RELEASE_TABLET | Freq: Three times a day (TID) | ORAL | 0 refills | Status: DC

## 2020-04-18 NOTE — Telephone Encounter (Signed)
Erx for potassium has been sent to [facility] for a 30 day supply.

## 2020-04-18 NOTE — Telephone Encounter (Signed)
LVM - informing that Potassium was denied because pt is due for an appointment.   If pt schedules appt we can send in enough to get her to that appointment if she is out.

## 2020-04-18 NOTE — Telephone Encounter (Signed)
  appointment scheduled for 5/25 Refill potassium chloride (KLOR-CON) 8 MEQ tablet

## 2020-04-24 ENCOUNTER — Encounter: Payer: Self-pay | Admitting: Internal Medicine

## 2020-04-24 ENCOUNTER — Other Ambulatory Visit: Payer: Self-pay

## 2020-04-24 ENCOUNTER — Ambulatory Visit: Payer: BC Managed Care – PPO | Admitting: Internal Medicine

## 2020-04-24 VITALS — BP 164/80 | HR 44 | Temp 98.2°F | Resp 16 | Ht 67.0 in | Wt 140.0 lb

## 2020-04-24 DIAGNOSIS — R001 Bradycardia, unspecified: Secondary | ICD-10-CM

## 2020-04-24 DIAGNOSIS — E876 Hypokalemia: Secondary | ICD-10-CM | POA: Diagnosis not present

## 2020-04-24 DIAGNOSIS — I10 Essential (primary) hypertension: Secondary | ICD-10-CM | POA: Diagnosis not present

## 2020-04-24 DIAGNOSIS — T502X5A Adverse effect of carbonic-anhydrase inhibitors, benzothiadiazides and other diuretics, initial encounter: Secondary | ICD-10-CM | POA: Diagnosis not present

## 2020-04-24 LAB — BASIC METABOLIC PANEL
BUN: 17 mg/dL (ref 6–23)
CO2: 29 mEq/L (ref 19–32)
Calcium: 9.8 mg/dL (ref 8.4–10.5)
Chloride: 102 mEq/L (ref 96–112)
Creatinine, Ser: 0.77 mg/dL (ref 0.40–1.20)
GFR: 75.45 mL/min (ref >60.00)
Glucose, Bld: 104 mg/dL — ABNORMAL HIGH (ref 70–99)
Potassium: 3.4 mEq/L — ABNORMAL LOW (ref 3.5–5.1)
Sodium: 137 mEq/L (ref 135–145)

## 2020-04-24 LAB — MAGNESIUM: Magnesium: 2.1 mg/dL (ref 1.5–2.5)

## 2020-04-24 LAB — TSH: TSH: 1.78 u[IU]/mL (ref 0.35–4.50)

## 2020-04-24 MED ORDER — POTASSIUM CHLORIDE ER 8 MEQ PO TBCR: 16.0000 meq | EXTENDED_RELEASE_TABLET | Freq: Two times a day (BID) | ORAL | 1 refills | Status: DC

## 2020-04-24 MED ORDER — IRBESARTAN 150 MG PO TABS: 150.0000 mg | ORAL_TABLET | Freq: Every day | ORAL | 1 refills | Status: DC

## 2020-04-24 NOTE — Progress Notes (Signed)
Subjective:  Patient ID: Yvette Walton, female    DOB: 1956/10/03  Age: 64 y.o. MRN: [id number]  CC: Hypertension  This visit occurred during the SARS-CoV-2 public health emergency.  Safety protocols were in place, including screening questions prior to the visit, additional usage of staff PPE, and extensive cleaning of exam room while observing appropriate contact time as indicated for disinfecting solutions.    HPI Yvette Colucci presents for f/up - She is concerned that her blood pressure is not adequately well controlled.  At home her systolic is consistently above 140.  She denies any recent episodes of headache, blurred vision, chest pain, shortness of breath.  She said she exercises quite a bit and denies any recent episodes of dizziness, lightheadedness, palpitations, syncope, or near syncope.  Outpatient Medications Prior to Visit  Medication Sig Dispense Refill  . indapamide (LOZOL) 1.25 MG tablet Take 1 tablet (1.25 mg total) by mouth daily. 90 tablet 1  . albuterol (PROVENTIL HFA) 108 (90 BASE) MCG/ACT inhaler Inhale 2 puffs into the lungs every 6 (six) hours as needed for wheezing or shortness of breath. 1 Inhaler prn  . benzonatate (TESSALON) 200 MG capsule Take 1 capsule (200 mg total) by mouth 3 (three) times daily as needed for cough. 45 capsule 0  . fluticasone (FLONASE) 50 MCG/ACT nasal spray Place 2 sprays into both nostrils daily. 16 g 6  . potassium chloride (KLOR-CON) 8 MEQ tablet Take 1 tablet (8 mEq total) by mouth 3 (three) times daily. 90 tablet 0   No facility-administered medications prior to visit.    ROS Review of Systems  Constitutional: Negative for appetite change, diaphoresis, fatigue and unexpected weight change.  HENT: Negative.   Eyes: Negative for visual disturbance.  Respiratory: Negative for cough, chest tightness, shortness of breath and wheezing.   Cardiovascular: Negative for chest pain, palpitations and leg swelling.  Gastrointestinal: Negative  for abdominal pain, diarrhea, nausea and vomiting.  Endocrine: Negative.   Genitourinary: Negative.  Negative for difficulty urinating.  Musculoskeletal: Negative.  Negative for arthralgias and myalgias.  Neurological: Negative.  Negative for dizziness, syncope, weakness and light-headedness.  Hematological: Negative for adenopathy. Does not bruise/bleed easily.  Psychiatric/Behavioral: Negative.     Objective:  BP (!) 164/80 (BP Location: Left Arm, Patient Position: Sitting, Cuff Size: Normal)   Pulse (!) 44   Temp 98.2 F (36.8 C) (Oral)   Resp 16   Ht 5\' 7"  (1.702 m)   Wt 140 lb (63.5 kg)   SpO2 96%   BMI 21.93 kg/m   BP Readings from Last 3 Encounters:  04/24/20 (!) 164/80  07/18/19 (!) 152/84  07/07/19 (!) 170/78    Wt Readings from Last 3 Encounters:  04/24/20 140 lb (63.5 kg)  07/18/19 140 lb 12 oz (63.8 kg)  07/07/19 142 lb 8 oz (64.6 kg)    Physical Exam Vitals reviewed.  HENT:     Nose: Nose normal.     Mouth/Throat:     Mouth: Mucous membranes are moist.  Eyes:     General: No scleral icterus.    Conjunctiva/sclera: Conjunctivae normal.  Cardiovascular:     Rate and Rhythm: Regular rhythm. Bradycardia present.     Heart sounds: Normal heart sounds, S1 normal and S2 normal. No murmur. No systolic murmur. No diastolic murmur.     Comments: EKG - Sinus bradycardia, 44 bpm T wave inversion and flattening in II, III, and V1. No changes from the prior EKG. Pulmonary:     Effort:  Pulmonary effort is normal.     Breath sounds: No stridor. No wheezing, rhonchi or rales.  Abdominal:     General: Abdomen is flat. Bowel sounds are normal. There is no distension.     Palpations: Abdomen is soft. There is no hepatomegaly, splenomegaly or mass.     Tenderness: There is no abdominal tenderness.  Musculoskeletal:     Cervical back: Neck supple.     Right lower leg: No edema.     Left lower leg: No edema.  Lymphadenopathy:     Cervical: No cervical adenopathy.   Skin:    General: Skin is warm.     Findings: No lesion or rash.  Neurological:     General: No focal deficit present.     Mental Status: She is oriented to person, place, and time. Mental status is at baseline.  Psychiatric:        Mood and Affect: Mood normal.     Lab Results  Component Value Date   WBC 6.7 07/07/2019   HGB 13.9 07/07/2019   HCT 41.9 07/07/2019   PLT 214.0 07/07/2019   GLUCOSE 104 (H) 04/24/2020   CHOL 193 07/07/2019   TRIG 199.0 (H) 07/07/2019   HDL 53.80 07/07/2019   LDLCALC 100 (H) 07/07/2019   ALT 12 07/07/2019   AST 14 07/07/2019   NA 137 04/24/2020   K 3.4 (L) 04/24/2020   CL 102 04/24/2020   CREATININE 0.77 04/24/2020   BUN 17 04/24/2020   CO2 29 04/24/2020   TSH 1.78 04/24/2020    MM 3D SCREEN BREAST BILATERAL  Result Date: 12/12/2019 CLINICAL DATA:  Screening. EXAM: DIGITAL SCREENING BILATERAL MAMMOGRAM WITH TOMO AND CAD COMPARISON:  Previous exam(s). ACR Breast Density Category b: There are scattered areas of fibroglandular density. FINDINGS: There are no findings suspicious for malignancy. Images were processed with CAD. IMPRESSION: No mammographic evidence of malignancy. A result letter of this screening mammogram will be mailed directly to the patient. RECOMMENDATION: Screening mammogram in one year. (Code:SM-B-01Y) BI-RADS CATEGORY  1: Negative. Electronically Signed   By: Sande Brothers M.D.   On: 12/12/2019 13:57    Assessment & Plan:   [place] was seen today for hypertension.  Diagnoses and all orders for this visit:  Essential hypertension- Her blood pressure is not adequately well controlled.  I recommended that she add an ARB to the thiazide diuretic. -     Basic metabolic panel; Future -     Magnesium; Future -     irbesartan (AVAPRO) 150 MG tablet; Take 1 tablet (150 mg total) by mouth daily. -     TSH; Future -     TSH -     Magnesium -     Basic metabolic panel -     potassium chloride (KLOR-CON) 8 MEQ tablet; Take 2  tablets (16 mEq total) by mouth 2 (two) times daily.  Diuretic-induced hypokalemia- Her potassium level remains slightly low.  I have asked her to increase the dose of the potassium supplement. -     Basic metabolic panel; Future -     Magnesium; Future -     Magnesium -     Basic metabolic panel -     potassium chloride (KLOR-CON) 8 MEQ tablet; Take 2 tablets (16 mEq total) by mouth 2 (two) times daily.  Bradycardia- She is asymptomatic with this and work-up for secondary causes is unremarkable.  I recommended observation at this time with no interventions. -  TSH; Future -     EKG 12-Lead -     TSH   I have discontinued [place] Conde "Gail"'s albuterol, fluticasone, and benzonatate. I have also changed her potassium chloride. Additionally, I am having her start on irbesartan. Lastly, I am having her maintain her indapamide.  Meds ordered this encounter  Medications  . irbesartan (AVAPRO) 150 MG tablet    Sig: Take 1 tablet (150 mg total) by mouth daily.    Dispense:  90 tablet    Refill:  1  . potassium chloride (KLOR-CON) 8 MEQ tablet    Sig: Take 2 tablets (16 mEq total) by mouth 2 (two) times daily.    Dispense:  360 tablet    Refill:  1     Follow-up: Return in about 3 months (around 07/25/2020).  Sanda Linger, MD

## 2020-04-24 NOTE — Patient Instructions (Signed)

## 2020-05-01 ENCOUNTER — Encounter: Payer: Self-pay | Admitting: Internal Medicine

## 2020-05-28 ENCOUNTER — Other Ambulatory Visit: Payer: Self-pay | Admitting: Internal Medicine

## 2020-05-28 DIAGNOSIS — E876 Hypokalemia: Secondary | ICD-10-CM

## 2020-05-28 DIAGNOSIS — I10 Essential (primary) hypertension: Secondary | ICD-10-CM

## 2020-07-26 ENCOUNTER — Other Ambulatory Visit: Payer: Self-pay | Admitting: Internal Medicine

## 2020-07-26 DIAGNOSIS — I10 Essential (primary) hypertension: Secondary | ICD-10-CM

## 2020-10-03 DIAGNOSIS — L820 Inflamed seborrheic keratosis: Secondary | ICD-10-CM | POA: Diagnosis not present

## 2020-10-03 DIAGNOSIS — B078 Other viral warts: Secondary | ICD-10-CM | POA: Diagnosis not present

## 2020-10-21 ENCOUNTER — Other Ambulatory Visit: Payer: Self-pay | Admitting: Internal Medicine

## 2020-10-21 DIAGNOSIS — I10 Essential (primary) hypertension: Secondary | ICD-10-CM

## 2020-11-11 ENCOUNTER — Telehealth: Payer: Self-pay | Admitting: Internal Medicine

## 2020-11-11 DIAGNOSIS — I10 Essential (primary) hypertension: Secondary | ICD-10-CM

## 2020-11-11 DIAGNOSIS — T502X5A Adverse effect of carbonic-anhydrase inhibitors, benzothiadiazides and other diuretics, initial encounter: Secondary | ICD-10-CM

## 2020-11-11 DIAGNOSIS — E876 Hypokalemia: Secondary | ICD-10-CM

## 2020-11-26 NOTE — Telephone Encounter (Signed)
   Patient calling for status of refill Was dx code provided to pharmacy

## 2020-11-26 NOTE — Telephone Encounter (Signed)
LVM stating that an OV is needed for a refill.

## 2020-12-03 ENCOUNTER — Ambulatory Visit (INDEPENDENT_AMBULATORY_CARE_PROVIDER_SITE_OTHER): Payer: BC Managed Care – PPO

## 2020-12-03 ENCOUNTER — Ambulatory Visit: Payer: BC Managed Care – PPO | Admitting: Internal Medicine

## 2020-12-03 ENCOUNTER — Other Ambulatory Visit: Payer: Self-pay

## 2020-12-03 ENCOUNTER — Encounter: Payer: Self-pay | Admitting: Internal Medicine

## 2020-12-03 VITALS — BP 138/78 | HR 60 | Temp 98.7°F | Resp 16 | Ht 67.0 in | Wt 140.8 lb

## 2020-12-03 DIAGNOSIS — Z0000 Encounter for general adult medical examination without abnormal findings: Secondary | ICD-10-CM | POA: Diagnosis not present

## 2020-12-03 DIAGNOSIS — R059 Cough, unspecified: Secondary | ICD-10-CM | POA: Diagnosis not present

## 2020-12-03 DIAGNOSIS — F5104 Psychophysiologic insomnia: Secondary | ICD-10-CM | POA: Insufficient documentation

## 2020-12-03 DIAGNOSIS — E876 Hypokalemia: Secondary | ICD-10-CM | POA: Diagnosis not present

## 2020-12-03 DIAGNOSIS — R058 Other specified cough: Secondary | ICD-10-CM

## 2020-12-03 DIAGNOSIS — I10 Essential (primary) hypertension: Secondary | ICD-10-CM | POA: Diagnosis not present

## 2020-12-03 DIAGNOSIS — R001 Bradycardia, unspecified: Secondary | ICD-10-CM | POA: Diagnosis not present

## 2020-12-03 DIAGNOSIS — T502X5A Adverse effect of carbonic-anhydrase inhibitors, benzothiadiazides and other diuretics, initial encounter: Secondary | ICD-10-CM

## 2020-12-03 DIAGNOSIS — Z0001 Encounter for general adult medical examination with abnormal findings: Secondary | ICD-10-CM | POA: Insufficient documentation

## 2020-12-03 LAB — URINALYSIS, ROUTINE W REFLEX MICROSCOPIC
Bilirubin Urine: NEGATIVE
Hgb urine dipstick: NEGATIVE
Ketones, ur: NEGATIVE
Leukocytes,Ua: NEGATIVE
Nitrite: NEGATIVE
RBC / HPF: NONE SEEN (ref 0–?)
Specific Gravity, Urine: 1.025 (ref 1.000–1.030)
Total Protein, Urine: NEGATIVE
Urine Glucose: NEGATIVE
Urobilinogen, UA: 0.2 (ref 0.0–1.0)
pH: 5 (ref 5.0–8.0)

## 2020-12-03 LAB — BASIC METABOLIC PANEL
BUN: 17 mg/dL (ref 6–23)
CO2: 27 mEq/L (ref 19–32)
Calcium: 9.6 mg/dL (ref 8.4–10.5)
Chloride: 103 mEq/L (ref 96–112)
Creatinine, Ser: 0.83 mg/dL (ref 0.40–1.20)
GFR: 74.35 mL/min (ref >60.00)
Glucose, Bld: 97 mg/dL (ref 70–99)
Potassium: 4 mEq/L (ref 3.5–5.1)
Sodium: 137 mEq/L (ref 135–145)

## 2020-12-03 LAB — CBC WITH DIFFERENTIAL/PLATELET
Basophils Absolute: 0.1 10*3/uL (ref 0.0–0.1)
Basophils Relative: 1.1 % (ref 0.0–3.0)
Eosinophils Absolute: 0.1 10*3/uL (ref 0.0–0.7)
Eosinophils Relative: 0.9 % (ref 0.0–5.0)
HCT: 38.2 % (ref 36.0–46.0)
Hemoglobin: 12.9 g/dL (ref 12.0–15.0)
Lymphocytes Relative: 19.1 % (ref 12.0–46.0)
Lymphs Abs: 1.5 10*3/uL (ref 0.7–4.0)
MCHC: 33.9 g/dL (ref 30.0–36.0)
MCV: 89.1 fl (ref 78.0–100.0)
Monocytes Absolute: 0.5 10*3/uL (ref 0.1–1.0)
Monocytes Relative: 6.5 % (ref 3.0–12.0)
Neutro Abs: 5.7 10*3/uL (ref 1.4–7.7)
Neutrophils Relative %: 72.4 % (ref 43.0–77.0)
Platelets: 232 10*3/uL (ref 150.0–400.0)
RBC: 4.29 Mil/uL (ref 3.87–5.11)
RDW: 12.9 % (ref 11.5–15.5)
WBC: 7.9 10*3/uL (ref 4.0–10.5)

## 2020-12-03 LAB — LIPID PANEL
Cholesterol: 163 mg/dL (ref 0–200)
HDL: 60.2 mg/dL (ref >39.00)
LDL Cholesterol: 85 mg/dL (ref 0–99)
NonHDL: 103
Total CHOL/HDL Ratio: 3
Triglycerides: 88 mg/dL (ref 0.0–149.0)
VLDL: 17.6 mg/dL (ref 0.0–40.0)

## 2020-12-03 LAB — TSH: TSH: 1.49 u[IU]/mL (ref 0.35–4.50)

## 2020-12-03 LAB — HEPATIC FUNCTION PANEL
ALT: 11 U/L (ref 0–35)
AST: 15 U/L (ref 0–37)
Albumin: 4.5 g/dL (ref 3.5–5.2)
Alkaline Phosphatase: 76 U/L (ref 39–117)
Bilirubin, Direct: 0.1 mg/dL (ref 0.0–0.3)
Total Bilirubin: 0.4 mg/dL (ref 0.2–1.2)
Total Protein: 7.5 g/dL (ref 6.0–8.3)

## 2020-12-03 LAB — MAGNESIUM: Magnesium: 2.1 mg/dL (ref 1.5–2.5)

## 2020-12-03 MED ORDER — ESZOPICLONE 2 MG PO TABS: 2.0000 mg | ORAL_TABLET | Freq: Every evening | ORAL | 1 refills | Status: DC | PRN

## 2020-12-03 NOTE — Patient Instructions (Signed)
Health Maintenance, Female Adopting a healthy lifestyle and getting preventive care are important in promoting health and wellness. Ask your health care provider about:  The right schedule for you to have regular tests and exams.  Things you can do on your own to prevent diseases and keep yourself healthy. What should I know about diet, weight, and exercise? Eat a healthy diet   Eat a diet that includes plenty of vegetables, fruits, low-fat dairy products, and lean protein.  Do not eat a lot of foods that are high in solid fats, added sugars, or sodium. Maintain a healthy weight Body mass index (BMI) is used to identify weight problems. It estimates body fat based on height and weight. Your health care provider can help determine your BMI and help you achieve or maintain a healthy weight. Get regular exercise Get regular exercise. This is one of the most important things you can do for your health. Most adults should:  Exercise for at least 150 minutes each week. The exercise should increase your heart rate and make you sweat (moderate-intensity exercise).  Do strengthening exercises at least twice a week. This is in addition to the moderate-intensity exercise.  Spend less time sitting. Even light physical activity can be beneficial. Watch cholesterol and blood lipids Have your blood tested for lipids and cholesterol at 65 years of age, then have this test every 5 years. Have your cholesterol levels checked more often if:  Your lipid or cholesterol levels are high.  You are older than 65 years of age.  You are at high risk for heart disease. What should I know about cancer screening? Depending on your health history and family history, you may need to have cancer screening at various ages. This may include screening for:  Breast cancer.  Cervical cancer.  Colorectal cancer.  Skin cancer.  Lung cancer. What should I know about heart disease, diabetes, and high blood  pressure? Blood pressure and heart disease  High blood pressure causes heart disease and increases the risk of stroke. This is more likely to develop in people who have high blood pressure readings, are of African descent, or are overweight.  Have your blood pressure checked: ? Every 3-5 years if you are 18-39 years of age. ? Every year if you are 40 years old or older. Diabetes Have regular diabetes screenings. This checks your fasting blood sugar level. Have the screening done:  Once every three years after age 40 if you are at a normal weight and have a low risk for diabetes.  More often and at a younger age if you are overweight or have a high risk for diabetes. What should I know about preventing infection? Hepatitis B If you have a higher risk for hepatitis B, you should be screened for this virus. Talk with your health care provider to find out if you are at risk for hepatitis B infection. Hepatitis C Testing is recommended for:  Everyone born from 1945 through 1965.  Anyone with known risk factors for hepatitis C. Sexually transmitted infections (STIs)  Get screened for STIs, including gonorrhea and chlamydia, if: ? You are sexually active and are younger than 65 years of age. ? You are older than 65 years of age and your health care provider tells you that you are at risk for this type of infection. ? Your sexual activity has changed since you were last screened, and you are at increased risk for chlamydia or gonorrhea. Ask your health care provider if   you are at risk.  Ask your health care provider about whether you are at high risk for HIV. Your health care provider may recommend a prescription medicine to help prevent HIV infection. If you choose to take medicine to prevent HIV, you should first get tested for HIV. You should then be tested every 3 months for as long as you are taking the medicine. Pregnancy  If you are about to stop having your period (premenopausal) and  you may become pregnant, seek counseling before you get pregnant.  Take 400 to 800 micrograms (mcg) of folic acid every day if you become pregnant.  Ask for birth control (contraception) if you want to prevent pregnancy. Osteoporosis and menopause Osteoporosis is a disease in which the bones lose minerals and strength with aging. This can result in bone fractures. If you are 65 years old or older, or if you are at risk for osteoporosis and fractures, ask your health care provider if you should:  Be screened for bone loss.  Take a calcium or vitamin D supplement to lower your risk of fractures.  Be given hormone replacement therapy (HRT) to treat symptoms of menopause. Follow these instructions at home: Lifestyle  Do not use any products that contain nicotine or tobacco, such as cigarettes, e-cigarettes, and chewing tobacco. If you need help quitting, ask your health care provider.  Do not use street drugs.  Do not share needles.  Ask your health care provider for help if you need support or information about quitting drugs. Alcohol use  Do not drink alcohol if: ? Your health care provider tells you not to drink. ? You are pregnant, may be pregnant, or are planning to become pregnant.  If you drink alcohol: ? Limit how much you use to 0-1 drink a day. ? Limit intake if you are breastfeeding.  Be aware of how much alcohol is in your drink. In the [place] one drink equals one 12 oz bottle of beer (355 mL), one 5 oz glass of wine (148 mL), or one 1 oz glass of hard liquor (44 mL). General instructions  Schedule regular health, dental, and eye exams.  Stay current with your vaccines.  Tell your health care provider if: ? You often feel depressed. ? You have ever been abused or do not feel safe at home. Summary  Adopting a healthy lifestyle and getting preventive care are important in promoting health and wellness.  Follow your health care provider's instructions about healthy  diet, exercising, and getting tested or screened for diseases.  Follow your health care provider's instructions on monitoring your cholesterol and blood pressure. This information is not intended to replace advice given to you by your health care provider. Make sure you discuss any questions you have with your health care provider. Document Revised: 11/10/2018 Document Reviewed: 11/10/2018 [employer]vier Patient Education  2020 [employer]  

## 2020-12-03 NOTE — Progress Notes (Signed)
Subjective:  Patient ID: Yvette Walton, female    DOB: [birth year]  Age: 65 y.o. MRN: [id number]  CC: Annual Exam, Hypertension, and Cough  This visit occurred during the SARS-CoV-2 public health emergency.  Safety protocols were in place, including screening questions prior to the visit, additional usage of staff PPE, and extensive cleaning of exam room while observing appropriate contact time as indicated for disinfecting solutions.    HPI Yvette Walton presents for a CPX.  1.  She complains of a 3-week history of cough productive of clear to white phlegm.  She denies chest pain, hemoptysis, shortness of breath, wheezing, fever, chills, sore throat, weight loss, or night sweats.  2.  She tells me her blood pressure has been well controlled.  When she checks it at home it is usually 117-120/78-80.  She is taking the ARB and thiazide diuretic.  She denies dizziness, lightheadedness, palpitations, presyncope, or near syncope.  She denies headache or blurred vision.  She is active and denies DOE.  3.  She complains of chronic insomnia with difficulty falling asleep and frequent awakenings.  She tells me that melatonin does not help with her symptoms.  Outpatient Medications Prior to Visit  Medication Sig Dispense Refill   indapamide (LOZOL) 1.25 MG tablet TAKE 1 TABLET (1.25 MG TOTAL) BY MOUTH DAILY. 30 tablet 5   irbesartan (AVAPRO) 150 MG tablet TAKE 1 TABLET BY MOUTH EVERY DAY 30 tablet 5   NON FORMULARY Green Marnal  1 scoop a day     potassium chloride (KLOR-CON) 8 MEQ tablet Take 2 tablets (16 mEq total) by mouth 2 (two) times daily. 360 tablet 1   No facility-administered medications prior to visit.    ROS Review of Systems  Constitutional: Negative.  Negative for appetite change, chills, diaphoresis, fatigue and fever.  HENT: Negative.  Negative for sore throat and trouble swallowing.   Eyes: Negative.   Respiratory: Positive for cough. Negative for chest tightness, shortness  of breath and wheezing.   Cardiovascular: Negative for chest pain, palpitations and leg swelling.  Gastrointestinal: Negative for abdominal pain, constipation, diarrhea, nausea and vomiting.  Endocrine: Negative.   Genitourinary: Negative.  Negative for difficulty urinating, dysuria and hematuria.  Musculoskeletal: Negative.  Negative for arthralgias and myalgias.  Skin: Negative.  Negative for color change, pallor and rash.  Neurological: Negative.  Negative for dizziness, weakness, light-headedness, numbness and headaches.  Hematological: Negative for adenopathy. Does not bruise/bleed easily.  Psychiatric/Behavioral: Negative.     Objective:  BP 138/78    Pulse 60    Temp 98.7 F (37.1 C) (Oral)    Resp 16    Ht 5\' 7"  (1.702 m)    Wt 140 lb 12.8 oz (63.9 kg)    SpO2 96%    BMI 22.05 kg/m   BP Readings from Last 3 Encounters:  12/03/20 138/78  04/24/20 (!) 164/80  07/18/19 (!) 152/84    Wt Readings from Last 3 Encounters:  12/03/20 140 lb 12.8 oz (63.9 kg)  04/24/20 140 lb (63.5 kg)  07/18/19 140 lb 12 oz (63.8 kg)    Physical Exam Vitals reviewed.  Constitutional:      Appearance: Normal appearance.  HENT:     Nose: Nose normal.     Mouth/Throat:     Mouth: Mucous membranes are moist.  Eyes:     General: No scleral icterus.    Conjunctiva/sclera: Conjunctivae normal.  Cardiovascular:     Rate and Rhythm: Normal rate and regular rhythm.  Heart sounds: No murmur heard.   Pulmonary:     Effort: Pulmonary effort is normal.     Breath sounds: No stridor. No wheezing, rhonchi or rales.  Abdominal:     General: Abdomen is flat. Bowel sounds are normal. There is no distension.     Palpations: Abdomen is soft. There is no hepatomegaly, splenomegaly or mass.     Tenderness: There is no abdominal tenderness.  Musculoskeletal:        General: Normal range of motion.     Cervical back: Neck supple.     Right lower leg: No edema.     Left lower leg: No edema.   Lymphadenopathy:     Cervical: No cervical adenopathy.  Skin:    General: Skin is warm and dry.  Neurological:     General: No focal deficit present.     Mental Status: She is alert. Mental status is at baseline.  Psychiatric:        Mood and Affect: Mood normal.        Behavior: Behavior normal.        Thought Content: Thought content normal.        Judgment: Judgment normal.     Lab Results  Component Value Date   WBC 7.9 12/03/2020   HGB 12.9 12/03/2020   HCT 38.2 12/03/2020   PLT 232.0 12/03/2020   GLUCOSE 97 12/03/2020   CHOL 163 12/03/2020   TRIG 88.0 12/03/2020   HDL 60.20 12/03/2020   LDLCALC 85 12/03/2020   ALT 11 12/03/2020   AST 15 12/03/2020   NA 137 12/03/2020   K 4.0 12/03/2020   CL 103 12/03/2020   CREATININE 0.83 12/03/2020   BUN 17 12/03/2020   CO2 27 12/03/2020   TSH 1.49 12/03/2020    MM 3D SCREEN BREAST BILATERAL  Result Date: 12/12/2019 CLINICAL DATA:  Screening. EXAM: DIGITAL SCREENING BILATERAL MAMMOGRAM WITH TOMO AND CAD COMPARISON:  Previous exam(s). ACR Breast Density Category b: There are scattered areas of fibroglandular density. FINDINGS: There are no findings suspicious for malignancy. Images were processed with CAD. IMPRESSION: No mammographic evidence of malignancy. A result letter of this screening mammogram will be mailed directly to the patient. RECOMMENDATION: Screening mammogram in one year. (Code:SM-B-01Y) BI-RADS CATEGORY  1: Negative. Electronically Signed   By: Kristopher Oppenheim M.D.   On: 12/12/2019 13:57   DG Chest 2 View  Result Date: 12/03/2020 CLINICAL DATA:  Cough, congestion for 10 days, hypertension, former smoker EXAM: CHEST - 2 VIEW COMPARISON:  None FINDINGS: Normal heart size, mediastinal contours, and pulmonary vascularity. Lungs clear. No pleural effusion or pneumothorax. Bones unremarkable. IMPRESSION: No acute abnormalities. Electronically Signed   By: Lavonia Dana M.D.   On: 12/03/2020 14:04    Assessment & Plan:    Yvette Walton was seen today for annual exam, hypertension and cough.  Diagnoses and all orders for this visit:  Essential hypertension- Her blood pressure is adequately well controlled.  Electrolytes and renal function are normal.  Will continue the current dose of the ARB and thiazide diuretic. -     Basic metabolic panel; Future -     Magnesium; Future -     CBC with Differential/Platelet; Future -     Hepatic function panel; Future -     Urinalysis, Routine w reflex microscopic; Future -     Urinalysis, Routine w reflex microscopic -     Hepatic function panel -     CBC with Differential/Platelet -  Magnesium -     Basic metabolic panel  Diuretic-induced hypokalemia- Her potassium level is normal now. -     Basic metabolic panel; Future -     Magnesium; Future -     Magnesium -     Basic metabolic panel  Bradycardia- She tolerates this well with no side effects or complications.  Work-up for secondary causes is unremarkable.  No intervention is needed at this time. -     TSH; Future -     TSH  Encounter for general adult medical examination with abnormal findings- Exam completed, labs reviewed, vaccines reviewed and updated, cancer screenings are up-to-date, patient education was given. -     Lipid panel; Future -     Lipid panel  Cough productive of clear sputum- Based on her symptoms, exam, normal chest x-ray this is a lingering viral URI.  Her symptoms are not severe enough to warrant treatment with a medication. -     DG Chest 2 View; Future  Psychophysiological insomnia -     eszopiclone (LUNESTA) 2 MG TABS tablet; Take 1 tablet (2 mg total) by mouth at bedtime as needed for sleep. Take immediately before bedtime   I am having Yvette Walton "Dondra Spry" start on eszopiclone. I am also having her maintain her potassium chloride, indapamide, irbesartan, and NON FORMULARY.  Meds ordered this encounter  Medications   eszopiclone (LUNESTA) 2 MG TABS tablet    Sig: Take 1  tablet (2 mg total) by mouth at bedtime as needed for sleep. Take immediately before bedtime    Dispense:  90 tablet    Refill:  1   In addition to time spent on CPE, I spent 50 minutes in preparing to see the patient by review of recent labs, imaging and procedures, obtaining and reviewing separately obtained history, communicating with the patient and family or caregiver, ordering medications, tests or procedures, and documenting clinical information in the EHR including the differential Dx, treatment, and any further evaluation and other management of 1. Essential hypertension 2. Diuretic-induced hypokalemia 3. Bradycardia 4. Cough productive of clear sputum 5. Psychophysiological insomnia    Follow-up: Return in about 6 months (around 06/02/2021).  Sanda Linger, MD

## 2020-12-20 ENCOUNTER — Other Ambulatory Visit: Payer: Self-pay | Admitting: Internal Medicine

## 2020-12-20 DIAGNOSIS — Z1231 Encounter for screening mammogram for malignant neoplasm of breast: Secondary | ICD-10-CM

## 2021-02-04 ENCOUNTER — Ambulatory Visit
Admission: RE | Admit: 2021-02-04 | Discharge: 2021-02-04 | Disposition: A | Discharge status: Home / Self Care | Payer: BC Managed Care – PPO | Source: Ambulatory Visit | Attending: Internal Medicine | Admitting: Internal Medicine

## 2021-02-04 ENCOUNTER — Other Ambulatory Visit: Payer: Self-pay

## 2021-02-04 ENCOUNTER — Inpatient Hospital Stay: Admission: RE | Admit: 2021-02-04 | Payer: BC Managed Care – PPO | Source: Ambulatory Visit

## 2021-02-04 DIAGNOSIS — Z1231 Encounter for screening mammogram for malignant neoplasm of breast: Secondary | ICD-10-CM | POA: Diagnosis not present

## 2021-02-05 LAB — HM MAMMOGRAPHY

## 2021-03-08 ENCOUNTER — Other Ambulatory Visit: Payer: Self-pay | Admitting: Internal Medicine

## 2021-03-08 DIAGNOSIS — I10 Essential (primary) hypertension: Secondary | ICD-10-CM

## 2021-03-08 MED ORDER — INDAPAMIDE 1.25 MG PO TABS: 1.2500 mg | ORAL_TABLET | Freq: Every day | ORAL | 5 refills | Status: DC

## 2021-03-08 NOTE — Telephone Encounter (Signed)
Requested: Indapamide Last Visit: 12/03/20 Next Visit: none Last Refilled: 07/26/20

## 2021-03-08 NOTE — Telephone Encounter (Signed)
Patient calling for third time today. Patient is very worried about getting this medication, since she will be going out of town and wont be returning until Wednesday. She is wondering what can be done at this point.

## 2021-03-08 NOTE — Telephone Encounter (Signed)
1.Medication Requested: indapamide (LOZOL) 1.25 MG tablet    2. Pharmacy (Name, Street, [place]): [id number] - [place], [place] - [address]   3. On Med List: yes   4. Last Visit with PCP: 12-03-20  5. Next visit date with PCP: n/a   Patient said that she will be leaving to go out of state on Sunday. She said that she is down to her last pill. Please advise.    Agent: Please be advised that RX refills may take up to 3 business days. We ask that you follow-up with your pharmacy.

## 2021-03-08 NOTE — Telephone Encounter (Signed)
Patient called again and was wondering if the refill could be sent in today if possible. She can be reached at [phone]. Please advise

## 2021-03-18 ENCOUNTER — Encounter: Payer: Self-pay | Admitting: Internal Medicine

## 2021-03-20 ENCOUNTER — Ambulatory Visit: Payer: Medicare Other | Admitting: Podiatrist

## 2021-03-20 ENCOUNTER — Other Ambulatory Visit: Payer: Self-pay

## 2021-03-20 ENCOUNTER — Ambulatory Visit (INDEPENDENT_AMBULATORY_CARE_PROVIDER_SITE_OTHER): Payer: Medicare Other

## 2021-03-20 DIAGNOSIS — M21619 Bunion of unspecified foot: Secondary | ICD-10-CM | POA: Diagnosis not present

## 2021-03-20 DIAGNOSIS — M21611 Bunion of right foot: Secondary | ICD-10-CM

## 2021-03-20 NOTE — Patient Instructions (Signed)
Bunion A bunion (hallux valgus) is a bump that forms slowly on the inner side of the big toe joint. It occurs when the big toe turns toward the second toe. Bunions may be small at first, but they often get larger over time. They can make walking painful. What are the causes? This condition may be caused by:  Wearing narrow or pointed shoes that force the big toe to press against the other toes.  Abnormal foot development that causes the foot to roll inward.  Changes in the foot that are caused by certain diseases, such as rheumatoid arthritis or polio.  A foot injury. What increases the risk? The following factors may make you more likely to develop this condition:  Wearing shoes that squeeze the toes together.  Having certain diseases, such as: ? Rheumatoid arthritis. ? Polio. ? Cerebral palsy.  Having family members who have bunions.  Being born with abnormally shaped feet (a foot deformity), such as flat feet or low arches.  Doing activities that put a lot of pressure on the feet, such as ballet dancing. What are the signs or symptoms? The main symptom of this condition is a bump on your big toe that you can notice. Other symptoms may include:  Pain.  Redness and inflammation around your big toe.  Thick or hardened skin on your big toe or between your toes.  Stiffness or loss of motion in your big toe.  Trouble with walking.   How is this diagnosed? This condition may be diagnosed based on your symptoms, medical history, and activities. You may also have tests and imaging, such as:  X-rays. These allow your health care provider to check the position of the bones in your foot and look for damage to your joint. They also help your health care provider determine the severity of your bunion and the best way to treat it.  Joint aspiration. In this test, a sample of fluid is removed from the toe joint. This test may be done if you are in a lot of pain. It helps rule out  diseases that cause painful swelling of the joints, such as arthritis or gout. How is this treated? Treatment depends on the severity of your symptoms. The goal of treatment is to relieve symptoms and prevent your bunion from getting worse. Your health care provider may recommend:  Wearing shoes that have a wide toe box, or using bunion pads to cushion the affected area.  Taping your toes together to keep them in a normal position.  Placing a device inside your shoe (orthotic device) to help reduce pressure on your toe joint.  Taking medicine to ease pain and inflammation.  Putting ice or heat on the affected area.  Doing stretching exercises.  Surgery, for severe cases. Follow these instructions at home: Managing pain, stiffness, and swelling  If directed, put ice on the painful area. To do this: ? Put ice in a plastic bag. ? Place a towel between your skin and the bag. ? Leave the ice on for 20 minutes, 2-3 times a day. ? Remove the ice if your skin turns bright red. This is very important. If you cannot feel pain, heat, or cold, you have a greater risk of damage to the area.  If directed, apply heat to the affected area before you exercise. Use the heat source that your health care provider recommends, such as a moist heat pack or a heating pad. ? Place a towel between your skin and the   heat source. ? Leave the heat on for 20-30 minutes. ? Remove the heat if your skin turns bright red. This is especially important if you are unable to feel pain, heat, or cold. You have a greater risk of getting burned.      General instructions  Do exercises as told by your health care provider.  Support your toe joint with proper footwear, shoe padding, or taping as told by your health care provider.  Take over-the-counter and prescription medicines only as told by your health care provider.  Do not use any products that contain nicotine or tobacco, such as cigarettes, e-cigarettes, and  chewing tobacco. If you need help quitting, ask your health care provider.  Keep all follow-up visits. This is important. Contact a health care provider if:  Your symptoms get worse.  Your symptoms do not improve in 2 weeks. Get help right away if:  You have severe pain and trouble with walking. Summary  A bunion is a bump on the inner side of the big toe joint that forms when the big toe turns toward the second toe.  Bunions can make walking painful.  Treatment depends on the severity of your symptoms.  Support your toe joint with proper footwear, shoe padding, or taping as told by your health care provider. This information is not intended to replace advice given to you by your health care provider. Make sure you discuss any questions you have with your health care provider. Document Revised: 03/23/2020 Document Reviewed: 03/23/2020 Elsevier Patient Education  2021 [employer]  

## 2021-03-27 ENCOUNTER — Encounter: Payer: Self-pay | Admitting: Podiatrist

## 2021-03-27 NOTE — Progress Notes (Signed)
Chief Complaint  Patient presents with  . Bunions    Right foot - very painful     HPI: Patient is 65 y.o. female who presents today for concern of a bunion on her right foot.  She states at times it can be painful but currently it is not bothering her.  She states she likes to walk for exercise and she doesn't want it to become painful if she can do anything to prevent that from happening.  She wears a good walking shoe and uses a bunion brace at night.  She has a history of plantar fasciitis and states at the moment, it is not bothering her.    Patient Active Problem List   Diagnosis Date Noted  . Encounter for general adult medical examination with abnormal findings 12/03/2020  . Cough productive of clear sputum 12/03/2020  . Psychophysiological insomnia 12/03/2020  . Diuretic-induced hypokalemia 07/18/2019  . Bradycardia 07/07/2019  . Essential hypertension 07/07/2019  . Osteopenia 04/13/2014  . Asthma with acute exacerbation 03/27/2014  . Routine general medical examination at a health care facility 03/22/2014  . ALLERGIC RHINITIS 12/29/2008  . Allergic-infective asthma 12/29/2008    Current Outpatient Medications on File Prior to Visit  Medication Sig Dispense Refill  . eszopiclone (LUNESTA) 2 MG TABS tablet Take 1 tablet (2 mg total) by mouth at bedtime as needed for sleep. Take immediately before bedtime 90 tablet 1  . indapamide (LOZOL) 1.25 MG tablet Take 1 tablet (1.25 mg total) by mouth daily. 30 tablet 5  . irbesartan (AVAPRO) 150 MG tablet TAKE 1 TABLET BY MOUTH EVERY DAY 30 tablet 5  . NON FORMULARY Green Marnal  1 scoop a day    . potassium chloride (KLOR-CON) 8 MEQ tablet Take 2 tablets (16 mEq total) by mouth 2 (two) times daily. 360 tablet 1   No current facility-administered medications on file prior to visit.    No Known Allergies  Review of Systems No fevers, chills, nausea, muscle aches, no difficulty breathing, no calf pain, no chest pain or shortness of  breath.   Physical Exam  GENERAL APPEARANCE: Alert, conversant. Appropriately groomed. No acute distress.   VASCULAR: Pedal pulses palpable DP and PT bilateral.  Capillary refill time is immediate to all digits,  Proximal to distal cooling it warm to warm.  Digital perfusion adequate.   NEUROLOGIC: sensation is intact to 5.07 monofilament at 5/5 sites bilateral.  Light touch is intact bilateral, vibratory sensation intact bilateral  MUSCULOSKELETAL: acceptable muscle strength, tone and stability bilateral.  Moderate bunion deformity is present on the right.  Range of motion is normal.  Hallux is abducting as well but not pushing on or against the second toe.   No reproducible pain with pressure on the first metatarsal head or with range of motion of the joint.  No orthopedic abnormalities noted on the left foot.   DERMATOLOGIC: skin is warm, supple, and dry.  No open lesions noted.  No rash, no pre ulcerative lesions. Digital nails are asymptomatic.    Xray evaluation:  3 views of the right foot are obtained.  A mild to moderate bunion deformity is noted with increase IM angle and hallux abductus noted.  No arthritic changes note.  No acute osseous abnormalities seen    Assessment     ICD-10-CM   1. Bunion  M21.619 DG Foot Complete Right     Plan  Discussed exam and xray findings with the patient. Discussed that she should continue wearing soft  and supportive shoes with a soft toe box to accomidate for the bunion on the right foot.  She may continue to use the bunion splints at night as they might be slowing down the progression of the deformity. Recommended she seek further treatment to correct the bunion if the bunion becomes painful and/or if it starts to rub/push agains the second toe.  She will call if she would like to schedule a consultation regarding surgery and if she has any further questions or concerns.

## 2021-04-25 ENCOUNTER — Other Ambulatory Visit: Payer: Self-pay | Admitting: Internal Medicine

## 2021-04-25 ENCOUNTER — Telehealth: Payer: Self-pay | Admitting: Internal Medicine

## 2021-04-25 DIAGNOSIS — I10 Essential (primary) hypertension: Secondary | ICD-10-CM

## 2021-04-25 MED ORDER — IRBESARTAN 150 MG PO TABS
150.0000 mg | ORAL_TABLET | Freq: Every day | ORAL | 5 refills | Status: DC
Start: 2021-04-25 — End: 2021-10-17

## 2021-04-25 NOTE — Telephone Encounter (Signed)
1.Medication Requested: irbesartan (AVAPRO) 150 MG tablet    2. Pharmacy (Name, Street, [place]): [id number] - [place], [place] - [address]  3. On Med List: yes   4. Last Visit with PCP: 12-03-20  5. Next visit date with PCP: n/a    Agent: Please be advised that RX refills may take up to 3 business days. We ask that you follow-up with your pharmacy.

## 2021-06-12 ENCOUNTER — Encounter: Payer: Self-pay | Admitting: Internal Medicine

## 2021-08-22 ENCOUNTER — Other Ambulatory Visit: Payer: Self-pay | Admitting: Internal Medicine

## 2021-08-22 DIAGNOSIS — I10 Essential (primary) hypertension: Secondary | ICD-10-CM

## 2021-10-17 ENCOUNTER — Other Ambulatory Visit: Payer: Self-pay | Admitting: Internal Medicine

## 2021-10-17 DIAGNOSIS — I10 Essential (primary) hypertension: Secondary | ICD-10-CM

## 2021-12-01 DIAGNOSIS — M810 Age-related osteoporosis without current pathological fracture: Secondary | ICD-10-CM

## 2021-12-01 HISTORY — DX: Age-related osteoporosis without current pathological fracture: M81.0

## 2021-12-16 DIAGNOSIS — H2513 Age-related nuclear cataract, bilateral: Secondary | ICD-10-CM | POA: Diagnosis not present

## 2021-12-16 DIAGNOSIS — H25013 Cortical age-related cataract, bilateral: Secondary | ICD-10-CM | POA: Diagnosis not present

## 2021-12-16 DIAGNOSIS — H18453 Nodular corneal degeneration, bilateral: Secondary | ICD-10-CM | POA: Diagnosis not present

## 2021-12-18 ENCOUNTER — Ambulatory Visit (INDEPENDENT_AMBULATORY_CARE_PROVIDER_SITE_OTHER): Payer: Medicare Other | Admitting: Internal Medicine

## 2021-12-18 ENCOUNTER — Encounter: Payer: Self-pay | Admitting: Internal Medicine

## 2021-12-18 ENCOUNTER — Other Ambulatory Visit: Payer: Self-pay

## 2021-12-18 VITALS — BP 138/78 | HR 55 | Temp 97.9°F | Resp 16 | Ht 67.0 in | Wt 147.0 lb

## 2021-12-18 DIAGNOSIS — Z0000 Encounter for general adult medical examination without abnormal findings: Secondary | ICD-10-CM

## 2021-12-18 DIAGNOSIS — F5104 Psychophysiologic insomnia: Secondary | ICD-10-CM

## 2021-12-18 DIAGNOSIS — I10 Essential (primary) hypertension: Secondary | ICD-10-CM | POA: Diagnosis not present

## 2021-12-18 DIAGNOSIS — Z0001 Encounter for general adult medical examination with abnormal findings: Secondary | ICD-10-CM

## 2021-12-18 DIAGNOSIS — E876 Hypokalemia: Secondary | ICD-10-CM | POA: Diagnosis not present

## 2021-12-18 DIAGNOSIS — E785 Hyperlipidemia, unspecified: Secondary | ICD-10-CM | POA: Diagnosis not present

## 2021-12-18 DIAGNOSIS — N941 Unspecified dyspareunia: Secondary | ICD-10-CM

## 2021-12-18 DIAGNOSIS — T502X5A Adverse effect of carbonic-anhydrase inhibitors, benzothiadiazides and other diuretics, initial encounter: Secondary | ICD-10-CM | POA: Diagnosis not present

## 2021-12-18 LAB — URINALYSIS, ROUTINE W REFLEX MICROSCOPIC
Bilirubin Urine: NEGATIVE
Ketones, ur: NEGATIVE
Nitrite: NEGATIVE
Specific Gravity, Urine: 1.02 (ref 1.000–1.030)
Total Protein, Urine: NEGATIVE
Urine Glucose: NEGATIVE
Urobilinogen, UA: 0.2 (ref 0.0–1.0)
pH: 5.5 (ref 5.0–8.0)

## 2021-12-18 LAB — HEPATIC FUNCTION PANEL
ALT: 13 U/L (ref 0–35)
AST: 17 U/L (ref 0–37)
Albumin: 4.5 g/dL (ref 3.5–5.2)
Alkaline Phosphatase: 89 U/L (ref 39–117)
Bilirubin, Direct: 0 mg/dL (ref 0.0–0.3)
Total Bilirubin: 0.3 mg/dL (ref 0.2–1.2)
Total Protein: 7.8 g/dL (ref 6.0–8.3)

## 2021-12-18 LAB — CBC WITH DIFFERENTIAL/PLATELET
Basophils Absolute: 0.1 10*3/uL (ref 0.0–0.1)
Basophils Relative: 1.1 % (ref 0.0–3.0)
Eosinophils Absolute: 0.1 10*3/uL (ref 0.0–0.7)
Eosinophils Relative: 2.6 % (ref 0.0–5.0)
HCT: 39.2 % (ref 36.0–46.0)
Hemoglobin: 12.8 g/dL (ref 12.0–15.0)
Lymphocytes Relative: 19.5 % (ref 12.0–46.0)
Lymphs Abs: 1 10*3/uL (ref 0.7–4.0)
MCHC: 32.6 g/dL (ref 30.0–36.0)
MCV: 90.5 fl (ref 78.0–100.0)
Monocytes Absolute: 0.4 10*3/uL (ref 0.1–1.0)
Monocytes Relative: 7.8 % (ref 3.0–12.0)
Neutro Abs: 3.5 10*3/uL (ref 1.4–7.7)
Neutrophils Relative %: 69 % (ref 43.0–77.0)
Platelets: 207 10*3/uL (ref 150.0–400.0)
RBC: 4.34 Mil/uL (ref 3.87–5.11)
RDW: 12.9 % (ref 11.5–15.5)
WBC: 5.1 10*3/uL (ref 4.0–10.5)

## 2021-12-18 LAB — LIPID PANEL
Cholesterol: 179 mg/dL (ref 0–200)
HDL: 59 mg/dL (ref >39.00)
LDL Cholesterol: 99 mg/dL (ref 0–99)
NonHDL: 120.2
Total CHOL/HDL Ratio: 3
Triglycerides: 106 mg/dL (ref 0.0–149.0)
VLDL: 21.2 mg/dL (ref 0.0–40.0)

## 2021-12-18 LAB — BASIC METABOLIC PANEL
BUN: 24 mg/dL — ABNORMAL HIGH (ref 6–23)
CO2: 29 mEq/L (ref 19–32)
Calcium: 9.5 mg/dL (ref 8.4–10.5)
Chloride: 100 mEq/L (ref 96–112)
Creatinine, Ser: 0.86 mg/dL (ref 0.40–1.20)
GFR: 70.73 mL/min (ref >60.00)
Glucose, Bld: 89 mg/dL (ref 70–99)
Potassium: 4.3 mEq/L (ref 3.5–5.1)
Sodium: 137 mEq/L (ref 135–145)

## 2021-12-18 LAB — TSH: TSH: 1.85 u[IU]/mL (ref 0.35–5.50)

## 2021-12-18 LAB — MAGNESIUM: Magnesium: 2.2 mg/dL (ref 1.5–2.5)

## 2021-12-18 MED ORDER — ESZOPICLONE 2 MG PO TABS: 2.0000 mg | ORAL_TABLET | Freq: Every evening | ORAL | 1 refills | Status: DC | PRN

## 2021-12-18 MED ORDER — POTASSIUM CHLORIDE ER 8 MEQ PO TBCR: 16.0000 meq | EXTENDED_RELEASE_TABLET | Freq: Two times a day (BID) | ORAL | 1 refills | Status: DC

## 2021-12-18 NOTE — Progress Notes (Signed)
Subjective:  Patient ID: Yvette Walton, female    DOB: 15-Sep-1956  Age: 66 y.o. MRN: [id number]  CC: Annual Exam and Hypertension  This visit occurred during the SARS-CoV-2 public health emergency.  Safety protocols were in place, including screening questions prior to the visit, additional usage of staff PPE, and extensive cleaning of exam room while observing appropriate contact time as indicated for disinfecting solutions.    HPI Yvette Walton presents for a CPX and f/up -   She tells me her blood pressure is well controlled.  She is active and denies chest pain, shortness of breath, diaphoresis, edema, presyncope, or fatigue.  Outpatient Medications Prior to Visit  Medication Sig Dispense Refill   indapamide (LOZOL) 1.25 MG tablet TAKE 1 TABLET BY MOUTH DAILY. 30 tablet 5   irbesartan (AVAPRO) 150 MG tablet TAKE 1 TABLET BY MOUTH EVERY DAY 30 tablet 5   NON FORMULARY Green Marnal  1 scoop a day     eszopiclone (LUNESTA) 2 MG TABS tablet Take 1 tablet (2 mg total) by mouth at bedtime as needed for sleep. Take immediately before bedtime 90 tablet 1   potassium chloride (KLOR-CON) 8 MEQ tablet Take 2 tablets (16 mEq total) by mouth 2 (two) times daily. 360 tablet 1   No facility-administered medications prior to visit.    ROS Review of Systems  Constitutional:  Negative for diaphoresis and fatigue.  HENT: Negative.    Eyes: Negative.   Respiratory:  Negative for cough, chest tightness, shortness of breath and wheezing.   Cardiovascular:  Negative for chest pain, palpitations and leg swelling.  Gastrointestinal:  Negative for abdominal pain, constipation, diarrhea, nausea and vomiting.  Endocrine: Negative.   Genitourinary:  Positive for dyspareunia. Negative for decreased urine volume, vaginal bleeding and vaginal discharge.  Musculoskeletal: Negative.  Negative for arthralgias and myalgias.  Skin: Negative.  Negative for color change.  Neurological:  Negative for dizziness,  syncope, speech difficulty, weakness and light-headedness.  Psychiatric/Behavioral:  Positive for sleep disturbance. Negative for behavioral problems, decreased concentration, dysphoric mood, hallucinations and suicidal ideas. The patient is not hyperactive.    Objective:  BP 138/78 (BP Location: Left Arm, Patient Position: Sitting, Cuff Size: Large)    Pulse (!) 55    Temp 97.9 F (36.6 C) (Oral)    Resp 16    Ht 5\' 7"  (1.702 m)    Wt 147 lb (66.7 kg)    SpO2 99%    BMI 23.02 kg/m   BP Readings from Last 3 Encounters:  12/18/21 138/78  12/03/20 138/78  04/24/20 (!) 164/80    Wt Readings from Last 3 Encounters:  12/18/21 147 lb (66.7 kg)  12/03/20 140 lb 12.8 oz (63.9 kg)  04/24/20 140 lb (63.5 kg)    Physical Exam Vitals reviewed.  HENT:     Nose: Nose normal.     Mouth/Throat:     Mouth: Mucous membranes are moist.  Eyes:     General: No scleral icterus.    Conjunctiva/sclera: Conjunctivae normal.  Cardiovascular:     Rate and Rhythm: Regular rhythm. Bradycardia present.     Heart sounds: Normal heart sounds, S1 normal and S2 normal. No murmur heard.   No gallop.     Comments: EKG- Sinus bradycardia, 46 bpm TWI in III and flat T waves ant/lateral are old No LVH or Q waves Pulmonary:     Effort: Pulmonary effort is normal.     Breath sounds: No stridor. No wheezing, rhonchi or rales.  Abdominal:     Palpations: There is no mass.     Tenderness: There is no abdominal tenderness. There is no guarding.     Hernia: No hernia is present.  Musculoskeletal:     Cervical back: Neck supple.     Right lower leg: No edema.     Left lower leg: No edema.  Lymphadenopathy:     Cervical: No cervical adenopathy.  Skin:    General: Skin is warm and dry.     Findings: No lesion or rash.  Neurological:     General: No focal deficit present.     Mental Status: She is alert. Mental status is at baseline.  Psychiatric:        Judgment: Judgment normal.    Lab Results   Component Value Date   WBC 5.1 12/18/2021   HGB 12.8 12/18/2021   HCT 39.2 12/18/2021   PLT 207.0 12/18/2021   GLUCOSE 89 12/18/2021   CHOL 179 12/18/2021   TRIG 106.0 12/18/2021   HDL 59.00 12/18/2021   LDLCALC 99 12/18/2021   ALT 13 12/18/2021   AST 17 12/18/2021   NA 137 12/18/2021   K 4.3 12/18/2021   CL 100 12/18/2021   CREATININE 0.86 12/18/2021   BUN 24 (H) 12/18/2021   CO2 29 12/18/2021   TSH 1.85 12/18/2021    MM 3D SCREEN BREAST BILATERAL  Result Date: 02/05/2021 CLINICAL DATA:  Screening. EXAM: DIGITAL SCREENING BILATERAL MAMMOGRAM WITH TOMOSYNTHESIS AND CAD TECHNIQUE: Bilateral screening digital craniocaudal and mediolateral oblique mammograms were obtained. Bilateral screening digital breast tomosynthesis was performed. The images were evaluated with computer-aided detection. COMPARISON:  Previous exam(s). ACR Breast Density Category b: There are scattered areas of fibroglandular density. FINDINGS: There are no findings suspicious for malignancy. The images were evaluated with computer-aided detection. IMPRESSION: No mammographic evidence of malignancy. A result letter of this screening mammogram will be mailed directly to the patient. RECOMMENDATION: Screening mammogram in one year. (Code:SM-B-01Y) BI-RADS CATEGORY  1: Negative. Electronically Signed   By: Sande BrothersSerena  Chacko M.D.   On: 02/05/2021 15:55    Assessment & Plan:   Yvette Walton was seen today for annual exam and hypertension.  Diagnoses and all orders for this visit:  Encounter for general adult medical examination with abnormal findings- Exam completed, labs reviewed, vaccines reviewed and updated, cancer screenings are up-to-date, patient education was given.  Essential hypertension- Her blood pressure is adequately well controlled. -     potassium chloride (KLOR-CON) 8 MEQ tablet; Take 2 tablets (16 mEq total) by mouth 2 (two) times daily. -     EKG 12-Lead -     Basic metabolic panel; Future -     TSH;  Future -     Urinalysis, Routine w reflex microscopic; Future -     Magnesium; Future -     CBC with Differential/Platelet; Future -     CBC with Differential/Platelet -     Magnesium -     Urinalysis, Routine w reflex microscopic -     TSH -     Basic metabolic panel  Diuretic-induced hypokalemia -     potassium chloride (KLOR-CON) 8 MEQ tablet; Take 2 tablets (16 mEq total) by mouth 2 (two) times daily. -     Basic metabolic panel; Future -     Magnesium; Future -     Magnesium -     Basic metabolic panel  Psychophysiological insomnia -     eszopiclone (LUNESTA) 2 MG TABS tablet; Take  1 tablet (2 mg total) by mouth at bedtime as needed for sleep. Take immediately before bedtime -     TSH; Future -     TSH  Dyslipidemia, goal LDL below 130- Statin therapy is not indicated. -     Lipid panel; Future -     TSH; Future -     Hepatic function panel; Future -     Hepatic function panel -     TSH -     Lipid panel  Dyspareunia, female -     Ambulatory referral to Gynecology   I am having Yvette Walton "Dondra Spry" maintain her NON FORMULARY, indapamide, irbesartan, potassium chloride, and eszopiclone.  Meds ordered this encounter  Medications   potassium chloride (KLOR-CON) 8 MEQ tablet    Sig: Take 2 tablets (16 mEq total) by mouth 2 (two) times daily.    Dispense:  360 tablet    Refill:  1   eszopiclone (LUNESTA) 2 MG TABS tablet    Sig: Take 1 tablet (2 mg total) by mouth at bedtime as needed for sleep. Take immediately before bedtime    Dispense:  90 tablet    Refill:  1     Follow-up: Return in about 6 months (around 06/17/2022).  Sanda Linger, MD

## 2021-12-18 NOTE — Patient Instructions (Signed)
Bradycardia, Adult °Bradycardia is a slower-than-normal heartbeat. A normal resting heart rate for an adult ranges from 60 to 100 beats per minute. With bradycardia, the resting heart rate is less than 60 beats per minute. °Bradycardia can prevent enough oxygen from reaching certain areas of your body when you are active. It can be serious if it keeps enough oxygen from reaching your brain and other parts of your body. Bradycardia is not a problem for everyone. For some healthy adults, a slow resting heart rate is normal. °What are the causes? °This condition may be caused by: °A problem with the heart, including: °A problem with the heart's electrical system, such as a heart block. With a heart block, electrical signals between the chambers of the heart are partially or completely blocked, so they are not able to work as they should. °A problem with the heart's natural pacemaker (sinus node). °Heart disease. °A heart attack. °Heart damage. °Lyme disease. °A heart infection. °A heart condition that is present at birth (congenital heart defect). °Certain medicines that treat heart conditions. °Certain conditions, such as hypothyroidism and obstructive sleep apnea. °Problems with the balance of chemicals and other substances, like potassium, in the blood. °Trauma. °Radiation therapy. °What increases the risk? °You are more likely to develop this condition if you: °Are age 65 or older. °Have high blood pressure (hypertension), high cholesterol (hyperlipidemia), or diabetes. °Drink heavily, use tobacco or nicotine products, or use drugs. °What are the signs or symptoms? °Symptoms of this condition include: °Light-headedness. °Feeling faint or fainting. °Fatigue and weakness. °Trouble with activity or exercise. °Shortness of breath. °Chest pain (angina). °Drowsiness. °Confusion. °Dizziness. °How is this diagnosed? °This condition may be diagnosed based on: °Your symptoms. °Your medical history. °A physical exam. °During  the exam, your health care provider will listen to your heartbeat and check your pulse. To confirm the diagnosis, your health care provider may order tests, such as: °Blood tests. °An electrocardiogram (ECG). This test records the heart's electrical activity. The test can show how fast your heart is beating and whether the heartbeat is steady. °A test in which you wear a portable device (event recorder or Holter monitor) to record your heart's electrical activity while you go about your day. °An exercise test. °How is this treated? °Treatment for this condition depends on the cause of the condition and how severe your symptoms are. Treatment may involve: °Treatment of the underlying condition. °Changing your medicines or how much medicine you take. °Having a small, battery-operated device called a pacemaker implanted under the skin. When bradycardia occurs, this device can be used to increase your heart rate and help your heart beat in a regular rhythm. °Follow these instructions at home: °Lifestyle °Manage any health conditions that contribute to bradycardia as told by your health care provider. °Follow a heart-healthy diet. A nutrition specialist (dietitian) can help educate you about healthy food options and changes. °Follow an exercise program that is approved by your health care provider. °Maintain a healthy weight. °Try to reduce or manage your stress, such as with yoga or meditation. If you need help reducing stress, ask your health care provider. °Do not use any products that contain nicotine or tobacco. These products include cigarettes, chewing tobacco, and vaping devices, such as e-cigarettes. If you need help quitting, ask your health care provider. °Do not use illegal drugs. °Alcohol use °If you drink alcohol: °Limit how much you have to: °0-1 drink a day for women who are not pregnant. °0-2 drinks a day   for men. °Know how much alcohol is in a drink. In the [place] one drink equals one 12 oz bottle of  beer (355 mL), one 5 oz glass of wine (148 mL), or one 1½ oz glass of hard liquor (44 mL). °General instructions °Take over-the-counter and prescription medicines only as told by your health care provider. °Keep all follow-up visits. This is important. °How is this prevented? °In some cases, bradycardia may be prevented by: °Treating underlying medical problems. °Stopping behaviors or medicines that can trigger the condition. °Contact a health care provider if: °You feel light-headed or dizzy. °You almost faint. °You feel weak or are easily fatigued during physical activity. °You experience confusion or have memory problems. °Get help right away if: °You faint. °You have chest pains or an irregular heartbeat (palpitations). °You have trouble breathing. °These symptoms may represent a serious problem that is an emergency. Do not wait to see if the symptoms will go away. Get medical help right away. Call your local emergency services (911 in the [place] Do not drive yourself to the hospital. °Summary °Bradycardia is a slower-than-normal heartbeat. With bradycardia, the resting heart rate is less than 60 beats per minute. °Treatment for this condition depends on the cause. °Manage any health conditions that contribute to bradycardia as told by your health care provider. °Do not use any products that contain nicotine or tobacco. These products include cigarettes, chewing tobacco, and vaping devices, such as e-cigarettes. °Keep all follow-up visits. This is important. °This information is not intended to replace advice given to you by your health care provider. Make sure you discuss any questions you have with your health care provider. °Document Revised: 03/10/2021 Document Reviewed: 03/10/2021 °Elsevier Patient Education © 2022 Else[employer] Inc. ° °

## 2021-12-23 ENCOUNTER — Other Ambulatory Visit: Payer: Self-pay | Admitting: Internal Medicine

## 2021-12-23 ENCOUNTER — Encounter: Payer: Self-pay | Admitting: Internal Medicine

## 2021-12-23 DIAGNOSIS — Z1231 Encounter for screening mammogram for malignant neoplasm of breast: Secondary | ICD-10-CM

## 2021-12-26 ENCOUNTER — Ambulatory Visit: Payer: Medicare Other | Admitting: Obstetrics and Gynecology

## 2021-12-26 ENCOUNTER — Encounter: Payer: Self-pay | Admitting: Obstetrics and Gynecology

## 2021-12-26 ENCOUNTER — Other Ambulatory Visit: Payer: Self-pay

## 2021-12-26 VITALS — BP 112/68 | HR 62 | Ht 65.5 in | Wt 146.0 lb

## 2021-12-26 DIAGNOSIS — R350 Frequency of micturition: Secondary | ICD-10-CM

## 2021-12-26 DIAGNOSIS — N952 Postmenopausal atrophic vaginitis: Secondary | ICD-10-CM | POA: Diagnosis not present

## 2021-12-26 DIAGNOSIS — N941 Unspecified dyspareunia: Secondary | ICD-10-CM

## 2021-12-26 LAB — WET PREP FOR TRICH, YEAST, CLUE

## 2021-12-26 MED ORDER — ESTRADIOL 0.1 MG/GM VA CREA: TOPICAL_CREAM | VAGINAL | 0 refills | Status: DC

## 2021-12-26 NOTE — Patient Instructions (Signed)
Atrophic Vaginitis Atrophic vaginitis is a condition in which the tissues that line the vagina become dry and thin. This condition is most common in women who have stopped having regular menstrual periods (are in menopause). This usually starts when a woman is 45 to 66 years old. That is the time when a woman's estrogen levels begin to decrease. Estrogen is a female hormone. It helps to keep the tissues of the vagina moist. It stimulates the vagina to produce a clear fluid that lubricates the vagina for sex. This fluid also protects the vagina from infection. Lack of estrogen can cause the lining of the vagina to get thinner and dryer. The vagina may also shrink in size. It may become less elastic. Atrophic vaginitis tends to get worse over time as a woman's estrogen level drops. What are the causes? This condition is caused by the normal drop in estrogen that happens around the time of menopause. What increases the risk? Certain conditions or situations may lower a woman's estrogen level, leading to a higher risk for atrophic vaginitis. You are more likely to develop this condition if: You are taking medicines that block estrogen. You have had your ovaries removed. You are being treated for cancer with radiation or medicines (chemotherapy). You have given birth or are breastfeeding. You are older than age 50. You smoke. What are the signs or symptoms? Symptoms of this condition include: Pain, soreness, a feeling of pressure, or bleeding during sex (dyspareunia). Vaginal burning, irritation, or itching. Pain or bleeding when a speculum is used in a vaginal exam. Having burning pain while urinating. Vaginal discharge. In some cases, there are no symptoms. How is this diagnosed? This condition is diagnosed based on your medical history and a physical exam. This will include a pelvic exam that checks the vaginal tissues. Though rare, you may also have other tests, including: A urine test. A test  that checks the acid balance in your vagina (acid balance test). How is this treated? Treatment for this condition depends on how severe your symptoms are. Treatment may include: Using an over-the-counter vaginal lubricant before sex. Using a long-acting vaginal moisturizer. Using low-dose estrogen for moderate to severe symptoms that do not respond to other treatments. Options include creams, tablets, and inserts (vaginal rings). Before you use a vaginal estrogen, tell your health care provider if you have a history of: Breast cancer. Endometrial cancer. Blood clots. If you are not sexually active and your symptoms are very mild, you may not need treatment. Follow these instructions at home: Medicines Take over-the-counter and prescription medicines only as told by your health care provider. Do not use herbal or alternative medicines unless your health care provider says that you can. Use over-the-counter creams, lubricants, or moisturizers for dryness only as told by your health care provider. General instructions If your atrophic vaginitis is caused by menopause, discuss all of your menopause symptoms and treatment options with your health care provider. Do not douche. Do not use products that can make your vagina dry. These include: Scented feminine sprays. Scented tampons. Scented soaps. Vaginal sex can help to improve blood flow and elasticity of vaginal tissue. If you choose to have sex and it hurts, try using a water-soluble lubricant or moisturizer right before having sex. Contact a health care provider if: Your discharge looks different than normal. Your vagina has an unusual smell. You have new symptoms. Your symptoms do not improve with treatment. Your symptoms get worse. Summary Atrophic vaginitis is a condition in   which the tissues that line the vagina become dry and thin. It is most common in women who have stopped having regular menstrual periods (are in  menopause). Treatment options include using vaginal lubricants and low-dose vaginal estrogen. Contact a health care provider if your vagina has an unusual smell, or if your symptoms get worse or do not improve after treatment. This information is not intended to replace advice given to you by your health care provider. Make sure you discuss any questions you have with your health care provider. Document Revised: 05/17/2020 Document Reviewed: 05/17/2020 Elsevier Patient Education  2022 Elsevier Inc.  

## 2021-12-26 NOTE — Progress Notes (Signed)
GYNECOLOGY  VISIT   HPI: 66 y.o.   Single  Caucasian  female   [id number] with No LMP recorded. Patient is postmenopausal.   here for dyspareunia onset 2 - 3 years ago. Patient states some urinary frequency and had recent urine with PCP.    States she has had bleeding a couple of times with intercourse.  Pain is with initial and deep penetration.   No new partner.   No vaginal discharge, itching or odor.   Menopause in her mid 36s.   Recent urine with PCP 12/18/21 showing trace RBC, WBC, and bacteria. No UC sent.  GYNECOLOGIC HISTORY: No LMP recorded. Patient is postmenopausal. Contraception:  PMP Menopausal hormone therapy:  none Last mammogram:  02-05-21 Neg/Birads1--has appt.scheduled Last pap smear: 07-18-19 Neg:Neg HR HPV--unsure if any abnormals. If any abnormal it would have been years ago        OB History     Gravida  0   Para  0   Term  0   Preterm  0   AB  0   Living  0      SAB  0   IAB  0   Ectopic  0   Multiple  0   Live Births  0              Patient Active Problem List   Diagnosis Date Noted   Dyspareunia, female 12/18/2021   Dyslipidemia, goal LDL below 130 12/18/2021   Encounter for general adult medical examination with abnormal findings 12/03/2020   Psychophysiological insomnia 12/03/2020   Diuretic-induced hypokalemia 07/18/2019   Bradycardia 07/07/2019   Essential hypertension 07/07/2019   Osteopenia 04/13/2014   Asthma with acute exacerbation 03/27/2014   ALLERGIC RHINITIS 12/29/2008   Allergic-infective asthma 12/29/2008    Past Medical History:  Diagnosis Date   Allergic rhinitis    Asthma    Hypertension     Past Surgical History:  Procedure Laterality Date   NASAL SEPTUM SURGERY     TONSILLECTOMY      Current Outpatient Medications  Medication Sig Dispense Refill   eszopiclone (LUNESTA) 2 MG TABS tablet Take 1 tablet (2 mg total) by mouth at bedtime as needed for sleep. Take immediately before bedtime 90 tablet  1   indapamide (LOZOL) 1.25 MG tablet TAKE 1 TABLET BY MOUTH DAILY. 30 tablet 5   irbesartan (AVAPRO) 150 MG tablet TAKE 1 TABLET BY MOUTH EVERY DAY 30 tablet 5   NON FORMULARY Green Marnal  1 scoop a day     potassium chloride (KLOR-CON) 8 MEQ tablet Take 2 tablets (16 mEq total) by mouth 2 (two) times daily. 360 tablet 1   zinc gluconate 50 MG tablet Take 50 mg by mouth as needed.     No current facility-administered medications for this visit.     ALLERGIES: Patient has no known allergies.  Family History  Problem Relation Age of Onset   Coronary artery disease Mother    Coronary artery disease Father    Prostate cancer Father    Diabetes Father        AOD   Colon cancer Neg Hx    Esophageal cancer Neg Hx    Rectal cancer Neg Hx    Stomach cancer Neg Hx     Social History   Socioeconomic History   Marital status: Single    Spouse name: Not on file   Number of children: Not on file   Years of education: Not on file  Highest education level: Not on file  Occupational History   Occupation: Works full time    Employer: [employer]  Tobacco Use   Smoking status: Never   Smokeless tobacco: Never   Tobacco comments:    occasional smoker 20+ years ago. Social smoker.  Vaping Use   Vaping Use: Never used  Substance and Sexual Activity   Alcohol use: Yes    Alcohol/week: 2.0 standard drinks    Types: 2 Glasses of wine per week   Drug use: No   Sexual activity: Yes    Birth control/protection: Post-menopausal  Other Topics Concern   Not on file  Social History Narrative   Not on file   Social Determinants of Health   Financial Resource Strain: Not on file  Food Insecurity: Not on file  Transportation Needs: Not on file  Physical Activity: Not on file  Stress: Not on file  Social Connections: Not on file  Intimate Partner Violence: Not on file    Review of Systems  Genitourinary:  Positive for dyspareunia and frequency.  All other systems reviewed  and are negative.  PHYSICAL EXAMINATION:    BP 112/68    Pulse 62    Ht 5' 5.5" (1.664 m)    Wt 146 lb (66.2 kg)    SpO2 96%    BMI 23.93 kg/m     General appearance: alert, cooperative and appears stated age  Lungs: clear to auscultation bilaterally Heart: regular rate and rhythm Abdomen: soft, non-tender, no masses,  no organomegaly No abnormal inguinal nodes palpated   Pelvic: External genitalia:  no lesions              Urethra:  normal appearing urethra with no masses, tenderness or lesions              Bartholins and Skenes: normal                 Vagina: normal appearing vagina with normal color and discharge, no lesions              Cervix: no lesions                Bimanual Exam:  Uterus:  normal size, contour, position, consistency, mobility, non-tender              Adnexa: no mass, fullness, tenderness         Chaperone was present for exam:  Chapman Fitch., RN  ASSESSMENT  Dyspareunia.  Vaginal atrophy.  Urinary frequency.    PLAN  Wet prep:  negative yeast, clue cells and trichomonas.  Urinalysis and reflex culture sent. We discussed atrophy and tx options:  water based lubricants, cooking oils, vaginal estrogen, vaginal vit E, Osphena, and Drue Stager.  She will start vaginal estrogen cream, Estrace.  Instructed in use.  I discussed potential effect on breast cancer and clotting. FU in 6 weeks for recheck and breast and pelvic exam.    An After Visit Summary was printed and given to the patient.  35 min  total time was spent for this patient encounter, including preparation, face-to-face counseling with the patient, coordination of care, and documentation of the encounter.

## 2021-12-29 ENCOUNTER — Encounter: Payer: Self-pay | Admitting: Obstetrics and Gynecology

## 2021-12-29 LAB — URINALYSIS, COMPLETE W/RFL CULTURE
Bilirubin Urine: NEGATIVE
Casts: NONE SEEN /LPF
Glucose, UA: NEGATIVE
Hgb urine dipstick: NEGATIVE
Hyaline Cast: NONE SEEN /LPF
Ketones, ur: NEGATIVE
Leukocyte Esterase: NEGATIVE
Nitrites, Initial: NEGATIVE
Protein, ur: NEGATIVE
Specific Gravity, Urine: 1.015 (ref 1.001–1.035)
Yeast: NONE SEEN /HPF
pH: 7.5 (ref 5.0–8.0)

## 2021-12-29 LAB — CULTURE INDICATED

## 2021-12-29 LAB — URINE CULTURE
MICRO NUMBER:: 12929630
SPECIMEN QUALITY:: ADEQUATE

## 2021-12-29 MED ORDER — NITROFURANTOIN MONOHYD MACRO 100 MG PO CAPS: 100.0000 mg | ORAL_CAPSULE | Freq: Two times a day (BID) | ORAL | 0 refills | Status: DC

## 2022-01-09 ENCOUNTER — Telehealth: Payer: Self-pay

## 2022-01-09 ENCOUNTER — Ambulatory Visit: Payer: Medicare Other | Admitting: Obstetrics and Gynecology

## 2022-01-09 ENCOUNTER — Other Ambulatory Visit: Payer: Self-pay

## 2022-01-09 VITALS — BP 120/70 | HR 60 | Ht 65.5 in | Wt 146.0 lb

## 2022-01-09 DIAGNOSIS — Z87440 Personal history of urinary (tract) infections: Secondary | ICD-10-CM | POA: Diagnosis not present

## 2022-01-09 DIAGNOSIS — N760 Acute vaginitis: Secondary | ICD-10-CM | POA: Diagnosis not present

## 2022-01-09 LAB — WET PREP FOR TRICH, YEAST, CLUE

## 2022-01-09 MED ORDER — TRIAMCINOLONE ACETONIDE 0.025 % EX OINT: 1.0000 "application " | TOPICAL_OINTMENT | Freq: Two times a day (BID) | CUTANEOUS | 0 refills | Status: DC

## 2022-01-09 MED ORDER — FLUCONAZOLE 150 MG PO TABS
150.0000 mg | ORAL_TABLET | Freq: Once | ORAL | 0 refills | Status: AC
Start: 2022-01-09 — End: 2022-01-09

## 2022-01-09 MED ORDER — AMOXICILLIN-POT CLAVULANATE 500-125 MG PO TABS: 1.0000 | ORAL_TABLET | Freq: Two times a day (BID) | ORAL | 0 refills | Status: DC

## 2022-01-09 MED ORDER — NYSTATIN 100000 UNIT/GM EX OINT: 1.0000 "application " | TOPICAL_OINTMENT | Freq: Two times a day (BID) | CUTANEOUS | 0 refills | Status: DC

## 2022-01-09 NOTE — Telephone Encounter (Signed)
Patient was in 12/26/21 and urine culture confirmed E.Coli infection and she was treated with Macrobid x 5 days. She completed the Rx and felt well for a week or so.  However the last several days she complains frequency of urination has recurred. She bought a urine test at the pharmacy and said it was positive for leukocytes (slight) and nitrites (stong).   She also complains of her labia being tender and "raw" feeling. Slight vaginal itching. She said she is proned to yeast infections post antibiotic so she used two vaginal Yeast Guard suppositories last night at bedtime.  She asked about Dr. Edward Jolly refilling the antibiotic. I told her I thought Dr. Edward Jolly would probably like to see her but I would check with her first and let patient know.

## 2022-01-09 NOTE — Telephone Encounter (Signed)
Patient scheduled today at 4:30 pm

## 2022-01-09 NOTE — Telephone Encounter (Signed)
I recommend office visit please.   She will need a repeat urinalysis and culture and recheck.

## 2022-01-09 NOTE — Telephone Encounter (Signed)
Patient informed, message sent to appointments to schedule.  

## 2022-01-09 NOTE — Progress Notes (Signed)
GYNECOLOGY  VISIT   HPI: 66 y.o.   Single  Caucasian  female   G0P0000 with No LMP recorded. Patient is postmenopausal.   here for vulvar irritation/raw feeling. Patient states when she urinates she is only emptying small amounts frequency.  Had UC showing E Coli > 100,000 and was treated with Macrobid 100 mg po bid x 5 days. She felt better after taking abx.  She was not urinating as much until this week, when she frequency and small amounts of urine started again.   Used OTC yeast suppository last PM and last used Estrace cream 2 days ago.  Did AZO test strips at home and it was positive for leukocytes and nitrites.   GYNECOLOGIC HISTORY: No LMP recorded. Patient is postmenopausal. Contraception:  PMP Menopausal hormone therapy:  none Last mammogram:   02-05-21 Neg/Birads1--has appt.scheduled Last pap smear:  07-18-19 Neg:Neg HR HPV--unsure if any abnormals. If any abnormal it would have been years ago        OB History     Gravida  0   Para  0   Term  0   Preterm  0   AB  0   Living  0      SAB  0   IAB  0   Ectopic  0   Multiple  0   Live Births  0              Patient Active Problem List   Diagnosis Date Noted   Dyspareunia, female 12/18/2021   Dyslipidemia, goal LDL below 130 12/18/2021   Encounter for general adult medical examination with abnormal findings 12/03/2020   Psychophysiological insomnia 12/03/2020   Diuretic-induced hypokalemia 07/18/2019   Bradycardia 07/07/2019   Essential hypertension 07/07/2019   Osteopenia 04/13/2014   Asthma with acute exacerbation 03/27/2014   ALLERGIC RHINITIS 12/29/2008   Allergic-infective asthma 12/29/2008    Past Medical History:  Diagnosis Date   Allergic rhinitis    Asthma    Hypertension     Past Surgical History:  Procedure Laterality Date   NASAL SEPTUM SURGERY     TONSILLECTOMY      Current Outpatient Medications  Medication Sig Dispense Refill   estradiol (ESTRACE) 0.1 MG/GM vaginal  cream Use 1/2 g vaginally every night for the first 2 weeks, then use 1/2 g vaginally two or three times per week as needed to maintain symptom relief. 42.5 g 0   eszopiclone (LUNESTA) 2 MG TABS tablet Take 1 tablet (2 mg total) by mouth at bedtime as needed for sleep. Take immediately before bedtime 90 tablet 1   indapamide (LOZOL) 1.25 MG tablet TAKE 1 TABLET BY MOUTH DAILY. 30 tablet 5   irbesartan (AVAPRO) 150 MG tablet TAKE 1 TABLET BY MOUTH EVERY DAY 30 tablet 5   NON FORMULARY Green Marnal  1 scoop a day     potassium chloride (KLOR-CON) 8 MEQ tablet Take 2 tablets (16 mEq total) by mouth 2 (two) times daily. 360 tablet 1   zinc gluconate 50 MG tablet Take 50 mg by mouth as needed.     No current facility-administered medications for this visit.     ALLERGIES: Patient has no known allergies.  Family History  Problem Relation Age of Onset   Coronary artery disease Mother    Coronary artery disease Father    Prostate cancer Father    Diabetes Father        AOD   Colon cancer Neg Hx  Esophageal cancer Neg Hx    Rectal cancer Neg Hx    Stomach cancer Neg Hx     Social History   Socioeconomic History   Marital status: Single    Spouse name: Not on file   Number of children: Not on file   Years of education: Not on file   Highest education level: Not on file  Occupational History   Occupation: Works full time    Employer: [employer]  Tobacco Use   Smoking status: Never   Smokeless tobacco: Never   Tobacco comments:    occasional smoker 20+ years ago. Social smoker.  Vaping Use   Vaping Use: Never used  Substance and Sexual Activity   Alcohol use: Yes    Alcohol/week: 2.0 standard drinks    Types: 2 Glasses of wine per week   Drug use: No   Sexual activity: Yes    Birth control/protection: Post-menopausal  Other Topics Concern   Not on file  Social History Narrative   Not on file   Social Determinants of Health   Financial Resource Strain: Not on  file  Food Insecurity: Not on file  Transportation Needs: Not on file  Physical Activity: Not on file  Stress: Not on file  Social Connections: Not on file  Intimate Partner Violence: Not on file    Review of Systems  Genitourinary:  Positive for frequency.  All other systems reviewed and are negative.  PHYSICAL EXAMINATION:    BP 120/70    Pulse 60    Ht 5' 5.5" (1.664 m)    Wt 146 lb (66.2 kg)    SpO2 96%    BMI 23.93 kg/m     General appearance: alert, cooperative and appears stated age   Pelvic: External genitalia:  no lesions              Urethra:  normal appearing urethra with no masses, tenderness or lesions              Bartholins and Skenes: normal                 Vagina:  erythema of the introitus and specks of white discharge, no lesions              Cervix: no lesions                Bimanual Exam:  Uterus:  normal size, contour, position, consistency, mobility, non-tender              Adnexa: no mass, fullness, tenderness    Chaperone was present for exam:  Estill Bamberg, CMA  ASSESSMENT  UTI.  Vulvovaginitis.   PLAN  Urinalysis:  sg 1.024, pH 5.5, neg nitrites, 0 - 5 WBC, 0 - 2 RBC, 10 - 20 squams, moderate bacteria.  UC sent.  Rx for Augmentin 500 mg po bid x 5 days.  Rx for Diflucan 150 mg po x 1.  May repeat at the end of the course of the abx.  Rx for Nystatin and Triamcinolone ointment, mix in 1:1 ratio and apply externally bid for one week.  Stop vaginal estrogen cream until bladder and yeast infections are treated.  We discussed the vaginal estrogen cream use around the urethra as a way to prevent recurrence of UTIs. Call if no improvement.    An After Visit Summary was printed and given to the patient.  27 min  total time was spent for this patient encounter, including preparation,  face-to-face counseling with the patient, coordination of care, and documentation of the encounter.

## 2022-01-11 LAB — URINALYSIS, COMPLETE W/RFL CULTURE
Bilirubin Urine: NEGATIVE
Casts: NONE SEEN /LPF
Crystals: NONE SEEN /HPF
Glucose, UA: NEGATIVE
Hyaline Cast: NONE SEEN /LPF
Ketones, ur: NEGATIVE
Leukocyte Esterase: NEGATIVE
Nitrites, Initial: NEGATIVE
Protein, ur: NEGATIVE
Specific Gravity, Urine: 1.024 (ref 1.001–1.035)
Yeast: NONE SEEN /HPF
pH: 5.5 (ref 5.0–8.0)

## 2022-01-11 LAB — URINE CULTURE
MICRO NUMBER:: 12986972
SPECIMEN QUALITY:: ADEQUATE

## 2022-01-11 LAB — CULTURE INDICATED

## 2022-01-12 ENCOUNTER — Encounter: Payer: Self-pay | Admitting: Obstetrics and Gynecology

## 2022-01-15 ENCOUNTER — Telehealth: Payer: Self-pay

## 2022-01-15 NOTE — Telephone Encounter (Signed)
Please have her see me for an office visit.  I would like to do additional testing.

## 2022-01-15 NOTE — Telephone Encounter (Signed)
Patient was in 01/09/22 and treated for UTI. She finished the Augmentin Rx on 01/13/22 and yesterday 01/14/22 started again with urgency, frequency and decreased urination.  She is also burning externally.  She did take the 2 Diflucan with the last one yesterday and is using the ointments as prescribed.  She reports that after taking the antibiotic for a couple of days she was feeling "pretty good" but as soon as she finished it the sx returned."  She is willing to schedule office visit again if Dr. Quincy Simmonds wants to see her again.

## 2022-01-16 ENCOUNTER — Other Ambulatory Visit: Payer: Self-pay

## 2022-01-16 ENCOUNTER — Encounter: Payer: Self-pay | Admitting: Obstetrics and Gynecology

## 2022-01-16 ENCOUNTER — Ambulatory Visit: Payer: Medicare Other | Admitting: Obstetrics and Gynecology

## 2022-01-16 VITALS — BP 130/84 | HR 66 | Resp 16

## 2022-01-16 DIAGNOSIS — R350 Frequency of micturition: Secondary | ICD-10-CM

## 2022-01-16 DIAGNOSIS — N760 Acute vaginitis: Secondary | ICD-10-CM | POA: Diagnosis not present

## 2022-01-16 LAB — URINALYSIS, COMPLETE W/RFL CULTURE
Bacteria, UA: NONE SEEN /HPF
Bilirubin Urine: NEGATIVE
Glucose, UA: NEGATIVE
Hyaline Cast: NONE SEEN /LPF
Ketones, ur: NEGATIVE
Leukocyte Esterase: NEGATIVE
Nitrites, Initial: NEGATIVE
Protein, ur: NEGATIVE
RBC / HPF: NONE SEEN /HPF (ref 0–2)
Specific Gravity, Urine: 1.01 (ref 1.001–1.035)
WBC, UA: NONE SEEN /HPF (ref 0–5)
pH: 5.5 (ref 5.0–8.0)

## 2022-01-16 LAB — NO CULTURE INDICATED

## 2022-01-16 LAB — WET PREP FOR TRICH, YEAST, CLUE

## 2022-01-16 NOTE — Telephone Encounter (Signed)
I spoke with patient this morning and scheduled her for 1:15pm today per Dr. Edward Jolly.

## 2022-01-16 NOTE — Progress Notes (Signed)
GYNECOLOGY  VISIT   HPI: 66 y.o.   Single  Caucasian  female   G0P0000 with No LMP recorded. Patient is postmenopausal.   here for vulvar irritation and external irritation   She just treated with Macrobid and then Augmentin for e Coli UTI.   She took a course of Diflucan (2 tablets).   Last one was taken 2 days ago.   Use is using Nystatin and triamcinolone externally.  She is having urinary frequency and can be a small amount.  No dysuria.   She likes coffee.  Now decreasing to 2 cups in the am.  No tea.   She has tenderness and burning on the outside.   Using [employer].   Not currently sexually active.  Same partner for 11 years.  No concerns about STD exposure.  Taking cranberry soft gels.   GYNECOLOGIC HISTORY: No LMP recorded. Patient is postmenopausal. Contraception:  PMP Menopausal hormone therapy:  estrace Last mammogram:   02-05-21 Neg/Birads1--has appt.scheduled Last pap smear:   07-18-19 Neg:Neg HR HPV--unsure if any abnormals. If any abnormal it would have been years ago        OB History     Gravida  0   Para  0   Term  0   Preterm  0   AB  0   Living  0      SAB  0   IAB  0   Ectopic  0   Multiple  0   Live Births  0              Patient Active Problem List   Diagnosis Date Noted   Dyspareunia, female 12/18/2021   Dyslipidemia, goal LDL below 130 12/18/2021   Encounter for general adult medical examination with abnormal findings 12/03/2020   Psychophysiological insomnia 12/03/2020   Diuretic-induced hypokalemia 07/18/2019   Bradycardia 07/07/2019   Essential hypertension 07/07/2019   Osteopenia 04/13/2014   Asthma with acute exacerbation 03/27/2014   ALLERGIC RHINITIS 12/29/2008   Allergic-infective asthma 12/29/2008    Past Medical History:  Diagnosis Date   Allergic rhinitis    Asthma    Hypertension     Past Surgical History:  Procedure Laterality Date   NASAL SEPTUM SURGERY     TONSILLECTOMY      Current  Outpatient Medications  Medication Sig Dispense Refill   estradiol (ESTRACE) 0.1 MG/GM vaginal cream Use 1/2 g vaginally every night for the first 2 weeks, then use 1/2 g vaginally two or three times per week as needed to maintain symptom relief. 42.5 g 0   eszopiclone (LUNESTA) 2 MG TABS tablet Take 1 tablet (2 mg total) by mouth at bedtime as needed for sleep. Take immediately before bedtime 90 tablet 1   indapamide (LOZOL) 1.25 MG tablet TAKE 1 TABLET BY MOUTH DAILY. 30 tablet 5   irbesartan (AVAPRO) 150 MG tablet TAKE 1 TABLET BY MOUTH EVERY DAY 30 tablet 5   NON FORMULARY Green Marnal  1 scoop a day     nystatin ointment (MYCOSTATIN) Apply 1 application topically 2 (two) times daily. Apply to affected area for up to 7 days. 30 g 0   potassium chloride (KLOR-CON) 8 MEQ tablet Take 2 tablets (16 mEq total) by mouth 2 (two) times daily. 360 tablet 1   triamcinolone (KENALOG) 0.025 % ointment Apply 1 application topically 2 (two) times daily. Use for one week. 30 g 0   zinc gluconate 50 MG tablet Take 50 mg by  mouth as needed.     No current facility-administered medications for this visit.     ALLERGIES: Patient has no known allergies.  Family History  Problem Relation Age of Onset   Coronary artery disease Mother    Coronary artery disease Father    Prostate cancer Father    Diabetes Father        AOD   Colon cancer Neg Hx    Esophageal cancer Neg Hx    Rectal cancer Neg Hx    Stomach cancer Neg Hx     Social History   Socioeconomic History   Marital status: Single    Spouse name: Not on file   Number of children: Not on file   Years of education: Not on file   Highest education level: Not on file  Occupational History   Occupation: Works full time    Employer: [employer]  Tobacco Use   Smoking status: Never   Smokeless tobacco: Never   Tobacco comments:    occasional smoker 20+ years ago. Social smoker.  Vaping Use   Vaping Use: Never used  Substance and  Sexual Activity   Alcohol use: Yes    Alcohol/week: 2.0 standard drinks    Types: 2 Glasses of wine per week   Drug use: No   Sexual activity: Yes    Birth control/protection: Post-menopausal  Other Topics Concern   Not on file  Social History Narrative   Not on file   Social Determinants of Health   Financial Resource Strain: Not on file  Food Insecurity: Not on file  Transportation Needs: Not on file  Physical Activity: Not on file  Stress: Not on file  Social Connections: Not on file  Intimate Partner Violence: Not on file    Review of Systems  Genitourinary:  Positive for frequency.       External irritation- took diflucan 2/9 and 2/14   PHYSICAL EXAMINATION:    BP 130/84 (BP Location: Right Arm, Patient Position: Sitting, Cuff Size: Normal)    Pulse 66    Resp 16     General appearance: alert, cooperative and appears stated age   Pelvic: External genitalia:  no lesions              Urethra:  normal appearing urethra with no masses, tenderness or lesions              Bartholins and Skenes: normal                 Vagina: normal appearing vagina with normal color and discharge, no lesions              Cervix: no lesions                Bimanual Exam:  Uterus:  normal size, contour, position, consistency, mobility, non-tender              Adnexa: no mass, fullness, tenderness               Chaperone was present for exam:  Marchelle Folks, CMA  ASSESSMENT  Recent E Coli UTI.  Urinary frequency.  Vulvovaginitis.   PLAN  Urinalysis:  sg 1.010, ph 5.5, NS RBC, NS WBC, 0 - 5 squams, NS bacteria. No UC sent.  Wet prep:  negative.  Stop triamcinolone and nystatin.  Restart vaginal estrogen 1/2 gram pv and to urethra/introitus at hs 2 -3 times per week.  Reduce caffeine use.  Keep appointment for annual  exam on March 16.   25 min  total time was spent for this patient encounter, including preparation, face-to-face counseling with the patient, coordination of care, and  documentation of the encounter.  An After Visit Summary was printed and given to the patient.

## 2022-01-17 ENCOUNTER — Ambulatory Visit: Payer: Medicare Other | Admitting: Obstetrics and Gynecology

## 2022-02-06 ENCOUNTER — Ambulatory Visit
Admission: RE | Admit: 2022-02-06 | Discharge: 2022-02-06 | Disposition: A | Discharge status: Home / Self Care | Payer: Medicare Other | Source: Ambulatory Visit | Attending: Internal Medicine | Admitting: Internal Medicine

## 2022-02-06 DIAGNOSIS — Z1231 Encounter for screening mammogram for malignant neoplasm of breast: Secondary | ICD-10-CM

## 2022-02-13 ENCOUNTER — Ambulatory Visit: Payer: Medicare Other | Admitting: Obstetrics and Gynecology

## 2022-02-14 ENCOUNTER — Other Ambulatory Visit: Payer: Self-pay | Admitting: Internal Medicine

## 2022-02-14 DIAGNOSIS — I10 Essential (primary) hypertension: Secondary | ICD-10-CM

## 2022-02-24 ENCOUNTER — Other Ambulatory Visit (HOSPITAL_COMMUNITY)
Admission: RE | Admit: 2022-02-24 | Discharge: 2022-02-24 | Disposition: A | Discharge status: Home / Self Care | Payer: Medicare Other | Source: Ambulatory Visit | Attending: Obstetrics and Gynecology | Admitting: Obstetrics and Gynecology

## 2022-02-24 ENCOUNTER — Ambulatory Visit (INDEPENDENT_AMBULATORY_CARE_PROVIDER_SITE_OTHER): Payer: Medicare Other | Admitting: Obstetrics and Gynecology

## 2022-02-24 ENCOUNTER — Encounter: Payer: Self-pay | Admitting: Obstetrics and Gynecology

## 2022-02-24 ENCOUNTER — Other Ambulatory Visit: Payer: Self-pay

## 2022-02-24 VITALS — BP 134/80 | Ht 65.5 in | Wt 145.0 lb

## 2022-02-24 DIAGNOSIS — Z124 Encounter for screening for malignant neoplasm of cervix: Secondary | ICD-10-CM | POA: Diagnosis present

## 2022-02-24 DIAGNOSIS — Z01419 Encounter for gynecological examination (general) (routine) without abnormal findings: Secondary | ICD-10-CM

## 2022-02-24 DIAGNOSIS — Z113 Encounter for screening for infections with a predominantly sexual mode of transmission: Secondary | ICD-10-CM

## 2022-02-24 DIAGNOSIS — Z780 Asymptomatic menopausal state: Secondary | ICD-10-CM

## 2022-02-24 NOTE — Patient Instructions (Signed)
EXERCISE AND DIET:  We recommended that you start or continue a regular exercise program for good health. Regular exercise means any activity that makes your heart beat faster and makes you sweat.  We recommend exercising at least 30 minutes per day at least 3 days a week, preferably 4 or 5.  We also recommend a diet low in fat and sugar.  Inactivity, poor dietary choices and obesity can cause diabetes, heart attack, stroke, and kidney damage, among others.   ? ?ALCOHOL AND SMOKING:  Women should limit their alcohol intake to no more than 7 drinks/beers/glasses of wine (combined, not each!) per week. Moderation of alcohol intake to this level decreases your risk of breast cancer and liver damage. And of course, no recreational drugs are part of a healthy lifestyle.  And absolutely no smoking or even second hand smoke. Most people know smoking can cause heart and lung diseases, but did you know it also contributes to weakening of your bones? Aging of your skin?  Yellowing of your teeth and nails? ? ?CALCIUM AND VITAMIN D:  Adequate intake of calcium and Vitamin D are recommended.  The recommendations for exact amounts of these supplements seem to change often, but generally speaking 600 mg of calcium (either carbonate or citrate) and 800 units of Vitamin D per day seems prudent. Certain women may benefit from higher intake of Vitamin D.  If you are among these women, your doctor will have told you during your visit.  You can try Oscal or Citracal daily.  ? ?PAP SMEARS:  Pap smears, to check for cervical cancer or precancers,  have traditionally been done yearly, although recent scientific advances have shown that most women can have pap smears less often.  However, every woman still should have a physical exam from her gynecologist every year. It will include a breast check, inspection of the vulva and vagina to check for abnormal growths or skin changes, a visual exam of the cervix, and then an exam to evaluate the  size and shape of the uterus and ovaries.  And after 66 years of age, a rectal exam is indicated to check for rectal cancers. We will also provide age appropriate advice regarding health maintenance, like when you should have certain vaccines, screening for sexually transmitted diseases, bone density testing, colonoscopy, mammograms, etc.  ? ?MAMMOGRAMS:  All women over 68 years old should have a yearly mammogram. Many facilities now offer a "3D" mammogram, which may cost around $50 extra out of pocket. If possible,  we recommend you accept the option to have the 3D mammogram performed.  It both reduces the number of women who will be called back for extra views which then turn out to be normal, and it is better than the routine mammogram at detecting truly abnormal areas.   ? ?COLONOSCOPY:  Colonoscopy to screen for colon cancer is recommended for all women at age 1.  We know, you hate the idea of the prep.  We agree, BUT, having colon cancer and not knowing it is worse!!  Colon cancer so often starts as a polyp that can be seen and removed at colonscopy, which can quite literally save your life!  And if your first colonoscopy is normal and you have no family history of colon cancer, most women don't have to have it again for 10 years.  Once every ten years, you can do something that may end up saving your life, right?  We will be happy to help you  get it scheduled when you are ready.  Be sure to check your insurance coverage so you understand how much it will cost.  It may be covered as a preventative service at no cost, but you should check your particular policy.   ? ? ?

## 2022-02-24 NOTE — Progress Notes (Signed)
66 y.o. G0P0000 Single Caucasian female here for breast ad pelvic exam.  ? ?Has been seen for UTI symptoms and urinary frequency in addition to vulvovaginitis.  ? ?The estrogen cream caused burning.  ?She stopped the cream, and the burning resolved. ? ?She was able to have intercourse and did not have pain.  ? ?Long standing partner.  ?She is open to STD screening today. ? ?PCP:   Sanda Linger, MD ? ?No LMP recorded. Patient is postmenopausal.     ?  ?    ?Sexually active: Yes.    ?The current method of family planning is post menopausal status.    ?Exercising: Yes.    Home exercise routine includes walking 1 hrs per day. Gym/ health club routine includes cardio. ?Smoker:  no ? ?Health Maintenance: ?Pap:  07-18-19 Neg:Neg HR HPV--unsure if any abnormals. If any abnormal it would have been years ago ?MMG:  02/06/2022 No mammographic evidence of malignancy. BI-RADS CATEGORY  1: Negative. ?Colonoscopy:  10/24/2013 ?BMD:   05/02/2014  Result  osteopenia ?TDaP:  08/26/2017 ?Gardasil:   no ?Screening Labs: PCP. ? ? reports that she has never smoked. She has been exposed to tobacco smoke. She has never used smokeless tobacco. She reports current alcohol use of about 2.0 standard drinks per week. She reports that she does not use drugs. ? ?Past Medical History:  ?Diagnosis Date  ? Allergic rhinitis   ? Asthma   ? Hypertension   ? ? ?Past Surgical History:  ?Procedure Laterality Date  ? NASAL SEPTUM SURGERY    ? TONSILLECTOMY    ? ? ?Current Outpatient Medications  ?Medication Sig Dispense Refill  ? eszopiclone (LUNESTA) 2 MG TABS tablet Take 1 tablet (2 mg total) by mouth at bedtime as needed for sleep. Take immediately before bedtime 90 tablet 1  ? indapamide (LOZOL) 1.25 MG tablet TAKE 1 TABLET BY MOUTH EVERY DAY 30 tablet 5  ? irbesartan (AVAPRO) 150 MG tablet TAKE 1 TABLET BY MOUTH EVERY DAY 30 tablet 5  ? NON FORMULARY Green Marnal  1 scoop a day    ? nystatin ointment (MYCOSTATIN) Apply 1 application topically 2 (two) times  daily. Apply to affected area for up to 7 days. 30 g 0  ? potassium chloride (KLOR-CON) 8 MEQ tablet Take 2 tablets (16 mEq total) by mouth 2 (two) times daily. 360 tablet 1  ? triamcinolone (KENALOG) 0.025 % ointment Apply 1 application topically 2 (two) times daily. Use for one week. 30 g 0  ? zinc gluconate 50 MG tablet Take 50 mg by mouth as needed.    ? ?No current facility-administered medications for this visit.  ? ? ?Family History  ?Problem Relation Age of Onset  ? Coronary artery disease Mother   ? Coronary artery disease Father   ? Prostate cancer Father   ? Diabetes Father   ?     AOD  ? Colon cancer Neg Hx   ? Esophageal cancer Neg Hx   ? Rectal cancer Neg Hx   ? Stomach cancer Neg Hx   ? ? ?Review of Systems ? ?Exam:   ?BP 134/80 (BP Location: Right Arm, Patient Position: Sitting, Cuff Size: Normal)   Ht 5' 5.5" (1.664 m)   Wt 145 lb (65.8 kg)   SpO2 96%   BMI 23.76 kg/m?     ?General appearance: alert, cooperative and appears stated age ?Head: normocephalic, without obvious abnormality, atraumatic ?Neck: no adenopathy, supple, symmetrical, trachea midline and thyroid normal to  inspection and palpation ?Lungs: clear to auscultation bilaterally ?Breasts: normal appearance, no masses or tenderness, No nipple retraction or dimpling, No nipple discharge or bleeding, No axillary adenopathy ?Heart: regular rate and rhythm ?Abdomen: soft, non-tender; no masses, no organomegaly ?Extremities: extremities normal, atraumatic, no cyanosis or edema ?Skin: skin color, texture, turgor normal. No rashes or lesions ?Lymph nodes: cervical, supraclavicular, and axillary nodes normal. ?Neurologic: grossly normal ? ?Pelvic: External genitalia:  no lesions ?             No abnormal inguinal nodes palpated. ?             Urethra:  normal appearing urethra with no masses, tenderness or lesions ?             Bartholins and Skenes: normal    ?             Vagina: normal appearing vagina with normal color and discharge, no  lesions ?             Cervix: no lesions ?             Pap taken: yes. ?Bimanual Exam:  Uterus:  normal size, contour, position, consistency, mobility, non-tender ?             Adnexa: no mass, fullness, tenderness ?             Rectal exam: Yes.  .  Confirms. ?             Anus:  normal sphincter tone, no lesions ? ?Chaperone was present for exam:  yes. ? ?Assessment:   ?Well woman visit with gynecologic exam. ?Vaginal atrophy. ?Reaction to vaginal  Estrace cream.  ?Hx UTI.  ?STD screening. ?Cervical cancer screening.  ?Menopause.  ?Osteopenia.  ? ?Plan: ?Mammogram screening discussed. ?Self breast awareness reviewed. ?Pap and HR HPV as above. ?Guidelines for Calcium, Vitamin D, regular exercise program including cardiovascular and weight bearing exercise. ?Testing for GC/CT/trich from pap.  ?I ordered a BMD. ?FU prn and for breast and pelvic exam in 2 years.   ? ?After visit summary provided.  ? ?26 min  total time was spent for this patient encounter, including preparation, face-to-face counseling with the patient, coordination of care, and documentation of the encounter. ? ? ? ? ? ?

## 2022-02-27 ENCOUNTER — Ambulatory Visit: Payer: Medicare Other | Admitting: Radiology

## 2022-02-27 VITALS — BP 120/80

## 2022-02-27 DIAGNOSIS — N760 Acute vaginitis: Secondary | ICD-10-CM

## 2022-02-27 DIAGNOSIS — N952 Postmenopausal atrophic vaginitis: Secondary | ICD-10-CM

## 2022-02-27 DIAGNOSIS — R350 Frequency of micturition: Secondary | ICD-10-CM | POA: Diagnosis not present

## 2022-02-27 LAB — CYTOLOGY - PAP
Chlamydia: NEGATIVE
Comment: NEGATIVE
Comment: NEGATIVE
Comment: NORMAL
Diagnosis: NEGATIVE
Diagnosis: REACTIVE
Neisseria Gonorrhea: NEGATIVE
Trichomonas: NEGATIVE

## 2022-02-27 LAB — WET PREP FOR TRICH, YEAST, CLUE

## 2022-02-27 MED ORDER — NITROFURANTOIN MONOHYD MACRO 100 MG PO CAPS: 100.0000 mg | ORAL_CAPSULE | Freq: Two times a day (BID) | ORAL | 0 refills | Status: DC

## 2022-02-27 NOTE — Progress Notes (Signed)
? ? ? ? ?  Subjective: Yvette Walton is a 66 y.o. female who complains of vulvar burning, no vaginal discharge, urinary frequency, cloudy urine, no dysuria, voiding small amounts (sx's x's 3 days). Tried vaginal estrogen cream last month along with coconut oil and she had severe burning. ? ? ?- Sexually active with 1 female partner ?- Last sexual encounter: 5 days ago ?-Contraception: post menopausal ? ?Review of Systems - negative except for above ? ?Objective: ? ?-Vulva: without lesions or discharge ?-Vagina: scant blood and discharge present,wet prep obtained. Moderate atrophy ?-Cervix: no lesion or discharge, no CMT ?-Perineum: no lesions ?-Uterus: Mobile, non tender ?-Adnexa: no masses or tenderness ? ? ?Microscopic wet-mount exam shows negative for pathogens, normal epithelial cells.  ? ?Chaperone offered and declined. ? ?Assessment:/Plan:  ? ?1. Urinary frequency ?Discussed last 2 urine cultures showed e.coli, hygiene reviewed in depth ?- Urinalysis,Complete w/RFL Culture ?- nitrofurantoin, macrocrystal-monohydrate, (MACROBID) 100 MG capsule; Take 1 capsule (100 mg total) by mouth 2 (two) times daily.  Dispense: 14 capsule; Refill: 0 ? ?2. Vaginal atrophy ?Coconut oil nightly x 5 days then try estrogen cream again   ?  ? ?Will contact patient with results of testing completed today. Avoid intercourse until symptoms are resolved. Safe sex encouraged. Avoid the use of soaps or perfumed products in the peri area. Avoid tub baths and sitting in sweaty or wet clothing for prolonged periods of time.   ?

## 2022-02-27 NOTE — Addendum Note (Signed)
Addended by: Fuller Song on: 02/27/2022 03:07 PM ? ? Modules accepted: Orders ? ?

## 2022-03-02 LAB — URINALYSIS, COMPLETE W/RFL CULTURE
Bilirubin Urine: NEGATIVE
Casts: NONE SEEN /LPF
Crystals: NONE SEEN /HPF
Glucose, UA: NEGATIVE
Hyaline Cast: NONE SEEN /LPF
Ketones, ur: NEGATIVE
Nitrites, Initial: NEGATIVE
Protein, ur: NEGATIVE
Specific Gravity, Urine: 1.02 (ref 1.001–1.035)
Yeast: NONE SEEN /HPF
pH: 5 (ref 5.0–8.0)

## 2022-03-02 LAB — URINE CULTURE
MICRO NUMBER:: 13201663
SPECIMEN QUALITY:: ADEQUATE

## 2022-03-02 LAB — CULTURE INDICATED

## 2022-03-06 ENCOUNTER — Encounter: Payer: Self-pay | Admitting: Radiology

## 2022-03-06 ENCOUNTER — Ambulatory Visit: Payer: Medicare Other | Admitting: Radiology

## 2022-03-06 VITALS — BP 130/80

## 2022-03-06 DIAGNOSIS — R350 Frequency of micturition: Secondary | ICD-10-CM

## 2022-03-06 NOTE — Progress Notes (Signed)
? ? ? ? ?  Subjective: Yvette Walton is a 66 y.o. female who was treated for UTI last week with Macrobid after culture came back showing susceptibility. She desires urine recheck.  Finished macrobid this morning.  Still complains of urinary frequency, urgency, no dysuria (symptoms have improved since last ov) ? ? ?- Sexually active with 1 female partner ?- Last sexual encounter: 2 weeks ago ?-Contraception: post menopausal ? ?Review of Systems - negative except for above ? ?Objective: ? ?Physical Exam ?Constitutional:   ?   Appearance: Normal appearance. She is normal weight.  ?Cardiovascular:  ?   Rate and Rhythm: Normal rate.  ?Pulmonary:  ?   Effort: Pulmonary effort is normal.  ?Abdominal:  ?   General: Abdomen is flat. Bowel sounds are normal.  ?   Palpations: Abdomen is soft.  ?Skin: ?   General: Skin is warm and dry.  ?Neurological:  ?   General: No focal deficit present.  ?   Mental Status: She is alert.  ?Psychiatric:     ?   Mood and Affect: Mood normal.     ?   Thought Content: Thought content normal.  ?  ?Urine dipstick shows positive for RBC's.  Micro exam: 0-5 WBC's per HPF and 0-2 RBC's per HPF.  ? ?Chaperone offered and declined. ? ?Assessment:/Plan:  ? ?1. Urinary frequency ?Discussed last 3 urine cultures showed e.coli, hygiene reviewed in depth ? ? ?Will contact patient with results of testing completed today. Avoid intercourse until symptoms are resolved. Safe sex encouraged. Avoid the use of soaps or perfumed products in the peri area. Avoid tub baths and sitting in sweaty or wet clothing for prolonged periods of time.   ?

## 2022-03-08 LAB — URINALYSIS, COMPLETE W/RFL CULTURE
Bacteria, UA: NONE SEEN /HPF
Bilirubin Urine: NEGATIVE
Glucose, UA: NEGATIVE
Hyaline Cast: NONE SEEN /LPF
Ketones, ur: NEGATIVE
Leukocyte Esterase: NEGATIVE
Nitrites, Initial: NEGATIVE
Protein, ur: NEGATIVE
Specific Gravity, Urine: 1.015 (ref 1.001–1.035)
pH: 6 (ref 5.0–8.0)

## 2022-03-08 LAB — URINE CULTURE
MICRO NUMBER:: 13231239
SPECIMEN QUALITY:: ADEQUATE

## 2022-03-08 LAB — CULTURE INDICATED

## 2022-03-17 ENCOUNTER — Telehealth: Payer: Self-pay

## 2022-03-17 ENCOUNTER — Encounter: Payer: Self-pay | Admitting: Internal Medicine

## 2022-03-17 NOTE — Telephone Encounter (Signed)
Yvette Walton is calling to give the quantity limit for eszopiclone (LUNESTA) 2 MG TABS tablet 90 tabs per year. ? ?If pt is required to continue to take the medication a PA will need to be completed.   ? ?Please advise ?

## 2022-03-18 NOTE — Telephone Encounter (Signed)
Pt checking status of PA, informed pt request has been routed to cma, pt requesting a cb w/ status update ? ?Pt requesting a 2 day supply of eszopiclone (LUNESTA) 2 MG TABS tablet   ? ?Pt states she will pay out of pocket ? ? ? ? ?

## 2022-03-19 NOTE — Telephone Encounter (Signed)
Key: [id number] ?

## 2022-03-19 NOTE — Telephone Encounter (Signed)
PA approved through 11/30/2022.  

## 2022-03-20 ENCOUNTER — Telehealth: Payer: Self-pay | Admitting: Internal Medicine

## 2022-03-20 ENCOUNTER — Other Ambulatory Visit: Payer: Self-pay | Admitting: Internal Medicine

## 2022-03-20 DIAGNOSIS — F5104 Psychophysiologic insomnia: Secondary | ICD-10-CM

## 2022-03-20 MED ORDER — ESZOPICLONE 2 MG PO TABS: 2.0000 mg | ORAL_TABLET | Freq: Every evening | ORAL | 1 refills | Status: DC | PRN

## 2022-03-20 NOTE — Telephone Encounter (Signed)
1.Medication Requested: eszopiclone (LUNESTA) 2 MG TABS tablet ? ?2. Pharmacy (Name, Street, Tira): [id number] - [place], [place] - [address] ? ?3. On Med List: Y ? ?4. Last Visit with PCP: 12-18-2021 ? ?5. Next visit date with PCP: n/a ? ? ?Agent: Please be advised that RX refills may take up to 3 business days. We ask that you follow-up with your pharmacy.  ?

## 2022-03-20 NOTE — Telephone Encounter (Signed)
Pt checking status of PA ?Informed pt PA approved through 11/30/2022 ? ?Pt verbalized understanding ?

## 2022-04-01 ENCOUNTER — Encounter: Payer: Self-pay | Admitting: Radiology

## 2022-04-01 ENCOUNTER — Ambulatory Visit: Payer: Medicare Other | Admitting: Radiology

## 2022-04-01 VITALS — BP 130/78

## 2022-04-01 DIAGNOSIS — R350 Frequency of micturition: Secondary | ICD-10-CM

## 2022-04-01 NOTE — Progress Notes (Signed)
? ? ? ? ?  SUBJECTIVE: Yvette Walton is a 66 y.o. female who complains of urinary frequency, urgency and dysuria x 2 days, without flank pain, fever, chills, or abnormal vaginal discharge or bleeding. Using estrogen cream twice weekly. ? ?OBJECTIVE: Appears well, in no apparent distress.  Vital signs are normal. The abdomen is soft without tenderness, guarding, mass, rebound or organomegaly. No CVA tenderness or inguinal adenopathy noted. Urine dipstick shows negative for all components.  Micro exam: few+ bacteria.  ?Pelvic exam: normal external genitalia, vulva, vagina, cervix, uterus and adnexa, VULVA: normal appearing vulva with no masses, tenderness or lesions, VAGINA: normal appearing vagina with normal color and discharge, no lesions. Atrophy improving. ? ?Chaperone offered and declined for exam. ? ?ASSESSMENT/PLAN: Dysuria ? ?Will send urine culture and sensitivity.  ?Treatment per orders - also push fluids, avoid bladder irritants. Instructed she may use Pyridium OTC prn. Call or return to clinic prn if these symptoms worsen or fail to improve as anticipated. Pyelo precautions reviewed with patient.  ?  ?

## 2022-04-03 ENCOUNTER — Other Ambulatory Visit: Payer: Self-pay

## 2022-04-03 DIAGNOSIS — N390 Urinary tract infection, site not specified: Secondary | ICD-10-CM

## 2022-04-03 MED ORDER — NITROFURANTOIN MONOHYD MACRO 100 MG PO CAPS: 100.0000 mg | ORAL_CAPSULE | Freq: Two times a day (BID) | ORAL | 0 refills | Status: AC

## 2022-04-04 LAB — URINALYSIS, COMPLETE W/RFL CULTURE
Bilirubin Urine: NEGATIVE
Casts: NONE SEEN /LPF
Crystals: NONE SEEN /HPF
Glucose, UA: NEGATIVE
Hgb urine dipstick: NEGATIVE
Hyaline Cast: NONE SEEN /LPF
Ketones, ur: NEGATIVE
Leukocyte Esterase: NEGATIVE
Nitrites, Initial: NEGATIVE
Protein, ur: NEGATIVE
RBC / HPF: NONE SEEN /HPF (ref 0–2)
Specific Gravity, Urine: 1.02 (ref 1.001–1.035)
Yeast: NONE SEEN /HPF
pH: 6 (ref 5.0–8.0)

## 2022-04-04 LAB — URINE CULTURE
MICRO NUMBER:: 13339453
SPECIMEN QUALITY:: ADEQUATE

## 2022-04-04 LAB — CULTURE INDICATED

## 2022-04-11 ENCOUNTER — Ambulatory Visit (INDEPENDENT_AMBULATORY_CARE_PROVIDER_SITE_OTHER): Payer: Medicare Other

## 2022-04-11 ENCOUNTER — Encounter: Payer: Self-pay | Admitting: Family Medicine

## 2022-04-11 ENCOUNTER — Ambulatory Visit (INDEPENDENT_AMBULATORY_CARE_PROVIDER_SITE_OTHER): Payer: Medicare Other | Admitting: Family Medicine

## 2022-04-11 VITALS — Ht 65.5 in

## 2022-04-11 DIAGNOSIS — Z0000 Encounter for general adult medical examination without abnormal findings: Secondary | ICD-10-CM | POA: Diagnosis not present

## 2022-04-11 DIAGNOSIS — Z87440 Personal history of urinary (tract) infections: Secondary | ICD-10-CM | POA: Diagnosis not present

## 2022-04-11 DIAGNOSIS — N898 Other specified noninflammatory disorders of vagina: Secondary | ICD-10-CM

## 2022-04-11 DIAGNOSIS — R350 Frequency of micturition: Secondary | ICD-10-CM | POA: Diagnosis not present

## 2022-04-11 LAB — POCT URINALYSIS DIPSTICK
Bilirubin, UA: NEGATIVE
Blood, UA: NEGATIVE
Glucose, UA: NEGATIVE
Ketones, UA: NEGATIVE
Leukocytes, UA: NEGATIVE
Nitrite, UA: NEGATIVE
Protein, UA: NEGATIVE
Spec Grav, UA: >=1.03 — AB (ref 1.010–1.025)
Urobilinogen, UA: 0.2 E.U./dL
pH, UA: 5.5 (ref 5.0–8.0)

## 2022-04-11 NOTE — Progress Notes (Signed)
Patient declined vitals today

## 2022-04-11 NOTE — Progress Notes (Signed)
? ?Subjective:  ? ? ? Patient ID: Yvette Walton, female    DOB: 1956/08/25, 66 y.o.   MRN: [id number] ? ?Chief Complaint  ?Patient presents with  ? Vaginal Itching  ?  Started in January, reoccurring UTIs and is currently on antibiotic and finishes Sunday morning.  ? ? ?HPI ?Patient is in today for recurrent UTIs.  ?States she often has burning near the urethra.  ? ?Her gynecologist, Dr. Quincy Simmonds, prescribed Macrobid and she is still taking this through the weekend.  ?She is also using Estrace cream for atrophy ? ? ?She is frustrated as to why she has had so many UTIs since January.  ? ?Using good hygiene. Switched from [employer] to [place] sensitive. Feels like she is doing all the right things to avoid UTIs.  ? ?Denies fever, chills, dizziness, chest pain, palpitations, shortness of breath, abdominal pain, N/V/D.  ? ? ? ? ?There are no preventive care reminders to display for this patient. ? ?Past Medical History:  ?Diagnosis Date  ? Allergic rhinitis   ? Asthma   ? Hypertension   ? ? ?Past Surgical History:  ?Procedure Laterality Date  ? NASAL SEPTUM SURGERY    ? TONSILLECTOMY    ? ? ?Family History  ?Problem Relation Age of Onset  ? Coronary artery disease Mother   ? Coronary artery disease Father   ? Prostate cancer Father   ? Diabetes Father   ?     AOD  ? Colon cancer Neg Hx   ? Esophageal cancer Neg Hx   ? Rectal cancer Neg Hx   ? Stomach cancer Neg Hx   ? ? ?Social History  ? ?Socioeconomic History  ? Marital status: Single  ?  Spouse name: Not on file  ? Number of children: Not on file  ? Years of education: Not on file  ? Highest education level: Not on file  ?Occupational History  ? Occupation: Works full time  ?  Employer: [employer]  ?Tobacco Use  ? Smoking status: Never  ?  Passive exposure: Past  ? Smokeless tobacco: Never  ? Tobacco comments:  ?  occasional smoker 20+ years ago. Social smoker.  ?Vaping Use  ? Vaping Use: Never used  ?Substance and Sexual Activity  ? Alcohol use: Yes  ?   Alcohol/week: 2.0 standard drinks  ?  Types: 2 Glasses of wine per week  ? Drug use: No  ? Sexual activity: Yes  ?  Birth control/protection: Post-menopausal  ?Other Topics Concern  ? Not on file  ?Social History Narrative  ? Not on file  ? ?Social Determinants of Health  ? ?Financial Resource Strain: Low Risk   ? Difficulty of Paying Living Expenses: Not hard at all  ?Food Insecurity: No Food Insecurity  ? Worried About Charity fundraiser in the Last Year: Never true  ? Ran Out of Food in the Last Year: Never true  ?Transportation Needs: No Transportation Needs  ? Lack of Transportation (Medical): No  ? Lack of Transportation (Non-Medical): No  ?Physical Activity: Sufficiently Active  ? Days of Exercise per Week: 5 days  ? Minutes of Exercise per Session: 30 min  ?Stress: No Stress Concern Present  ? Feeling of Stress : Not at all  ?Social Connections: Moderately Integrated  ? Frequency of Communication with Friends and Family: More than three times a week  ? Frequency of Social Gatherings with Friends and Family: More than three times a week  ?  Attends Religious Services: 1 to 4 times per year  ? Active Member of Clubs or Organizations: Yes  ? Attends Archivist Meetings: 1 to 4 times per year  ? Marital Status: Never married  ?Intimate Partner Violence: Not At Risk  ? Fear of Current or Ex-Partner: No  ? Emotionally Abused: No  ? Physically Abused: No  ? Sexually Abused: No  ? ? ?Outpatient Medications Prior to Visit  ?Medication Sig Dispense Refill  ? estradiol (ESTRACE) 0.1 MG/GM vaginal cream Place 1 Applicatorful vaginally at bedtime.    ? eszopiclone (LUNESTA) 2 MG TABS tablet Take 1 tablet (2 mg total) by mouth at bedtime as needed for sleep. Take immediately before bedtime 90 tablet 1  ? indapamide (LOZOL) 1.25 MG tablet TAKE 1 TABLET BY MOUTH EVERY DAY 30 tablet 5  ? irbesartan (AVAPRO) 150 MG tablet TAKE 1 TABLET BY MOUTH EVERY DAY 30 tablet 5  ? nitrofurantoin, macrocrystal-monohydrate,  (MACROBID) 100 MG capsule Take 1 capsule (100 mg total) by mouth 2 (two) times daily for 10 days. 20 capsule 0  ? NON FORMULARY Green Miracle  1 scoop a day    ? nystatin ointment (MYCOSTATIN) Apply 1 application topically 2 (two) times daily. Apply to affected area for up to 7 days. 30 g 0  ? potassium chloride (KLOR-CON) 8 MEQ tablet Take 2 tablets (16 mEq total) by mouth 2 (two) times daily. 360 tablet 1  ? triamcinolone (KENALOG) 0.025 % ointment Apply 1 application topically 2 (two) times daily. Use for one week. 30 g 0  ? zinc gluconate 50 MG tablet Take 50 mg by mouth as needed.    ? ?No facility-administered medications prior to visit.  ? ? ?Allergies  ?Allergen Reactions  ? Estrace [Estradiol]   ?  Patient developed burning with use of vaginal Estrace cream.  ? ? ?ROS ? ?   ?Objective:  ?  ?Physical Exam ? ?Ht 5' 5.5" (1.664 m)   BMI 23.76 kg/m?  ?Wt Readings from Last 3 Encounters:  ?02/24/22 145 lb (65.8 kg)  ?01/09/22 146 lb (66.2 kg)  ?12/26/21 146 lb (66.2 kg)  ? ?Alert and oriented and in no acute distress. Not otherwise examined.  ?   ?Assessment & Plan:  ? ?Problem List Items Addressed This Visit   ?None ?Visit Diagnoses   ? ? History of recurrent UTIs    -  Primary  ? Relevant Orders  ? Ambulatory referral to Urogynecology  ? Urinary frequency      ? Relevant Orders  ? POCT urinalysis dipstick (Completed)  ? Vaginal itching      ? Relevant Orders  ? POCT urinalysis dipstick (Completed)  ? ?  ? ?Discussed that her UA is negative today. She is taking Macrobid through 04/14/2022, prescribed by her gyn. ?Counseling on avoiding UTIs and she appears to be doing all the right things.   ?Referral to urogynecologist, Dr. Claudia Desanctis. Follow up with Dr. Quincy Simmonds and Dr. Ronnald Ramp as recommended.  ? ?I am having Yvette Roza "Baker Janus" maintain her NON FORMULARY, irbesartan, potassium chloride, zinc gluconate, nystatin ointment, triamcinolone, indapamide, eszopiclone, estradiol, and nitrofurantoin  (macrocrystal-monohydrate). ? ?No orders of the defined types were placed in this encounter. ? ? ?

## 2022-04-11 NOTE — Progress Notes (Signed)
?I connected with Yvette Walton today by telephone and verified that I am speaking with the correct person using two identifiers. ?Location patient: home ?Location provider: work ?Persons participating in the virtual visit: patient, provider. ?  ?I discussed the limitations, risks, security and privacy concerns of performing an evaluation and management service by telephone and the availability of in person appointments. I also discussed with the patient that there may be a patient responsible charge related to this service. The patient expressed understanding and verbally consented to this telephonic visit.  ?  ?Interactive audio and video telecommunications were attempted between this provider and patient, however failed, due to patient having technical difficulties OR patient did not have access to video capability.  We continued and completed visit with audio only. ? ?Some vital signs may be absent or patient reported.  ? ?Time Spent with patient on telephone encounter: 45 minutes ? ?Subjective:  ? Yvette Walton is a 66 y.o. female who presents for Medicare Annual (Subsequent) preventive examination. ? ?Review of Systems    ? ?Cardiac Risk Factors include: advanced age (>6055men, 69>65 women);dyslipidemia;hypertension;family history of premature cardiovascular disease ? ?   ?Objective:  ?  ?There were no vitals filed for this visit. ?There is no height or weight on file to calculate BMI. ? ? ?  04/11/2022  ? 12:07 PM  ?Advanced Directives  ?Does Patient Have a Medical Advance Directive? No  ? ? ?Current Medications (verified) ?Outpatient Encounter Medications as of 04/11/2022  ?Medication Sig  ? estradiol (ESTRACE) 0.1 MG/GM vaginal cream Place 1 Applicatorful vaginally at bedtime.  ? eszopiclone (LUNESTA) 2 MG TABS tablet Take 1 tablet (2 mg total) by mouth at bedtime as needed for sleep. Take immediately before bedtime  ? indapamide (LOZOL) 1.25 MG tablet TAKE 1 TABLET BY MOUTH EVERY DAY  ? irbesartan (AVAPRO) 150 MG  tablet TAKE 1 TABLET BY MOUTH EVERY DAY  ? nitrofurantoin, macrocrystal-monohydrate, (MACROBID) 100 MG capsule Take 1 capsule (100 mg total) by mouth 2 (two) times daily for 10 days.  ? NON FORMULARY Green Marnal  1 scoop a day  ? nystatin ointment (MYCOSTATIN) Apply 1 application topically 2 (two) times daily. Apply to affected area for up to 7 days.  ? potassium chloride (KLOR-CON) 8 MEQ tablet Take 2 tablets (16 mEq total) by mouth 2 (two) times daily.  ? triamcinolone (KENALOG) 0.025 % ointment Apply 1 application topically 2 (two) times daily. Use for one week.  ? zinc gluconate 50 MG tablet Take 50 mg by mouth as needed.  ? ?No facility-administered encounter medications on file as of 04/11/2022.  ? ? ?Allergies (verified) ?Estrace [estradiol]  ? ?History: ?Past Medical History:  ?Diagnosis Date  ? Allergic rhinitis   ? Asthma   ? Hypertension   ? ?Past Surgical History:  ?Procedure Laterality Date  ? NASAL SEPTUM SURGERY    ? TONSILLECTOMY    ? ?Family History  ?Problem Relation Age of Onset  ? Coronary artery disease Mother   ? Coronary artery disease Father   ? Prostate cancer Father   ? Diabetes Father   ?     AOD  ? Colon cancer Neg Hx   ? Esophageal cancer Neg Hx   ? Rectal cancer Neg Hx   ? Stomach cancer Neg Hx   ? ?Social History  ? ?Socioeconomic History  ? Marital status: Single  ?  Spouse name: Not on file  ? Number of children: Not on file  ? Years of education:  Not on file  ? Highest education level: Not on file  ?Occupational History  ? Occupation: Works full time  ?  Employer: [employer]  ?Tobacco Use  ? Smoking status: Never  ?  Passive exposure: Past  ? Smokeless tobacco: Never  ? Tobacco comments:  ?  occasional smoker 20+ years ago. Social smoker.  ?Vaping Use  ? Vaping Use: Never used  ?Substance and Sexual Activity  ? Alcohol use: Yes  ?  Alcohol/week: 2.0 standard drinks  ?  Types: 2 Glasses of wine per week  ? Drug use: No  ? Sexual activity: Yes  ?  Birth control/protection:  Post-menopausal  ?Other Topics Concern  ? Not on file  ?Social History Narrative  ? Not on file  ? ?Social Determinants of Health  ? ?Financial Resource Strain: Low Risk   ? Difficulty of Paying Living Expenses: Not hard at all  ?Food Insecurity: No Food Insecurity  ? Worried About Programme researcher, broadcasting/film/video in the Last Year: Never true  ? Ran Out of Food in the Last Year: Never true  ?Transportation Needs: No Transportation Needs  ? Lack of Transportation (Medical): No  ? Lack of Transportation (Non-Medical): No  ?Physical Activity: Sufficiently Active  ? Days of Exercise per Week: 5 days  ? Minutes of Exercise per Session: 30 min  ?Stress: No Stress Concern Present  ? Feeling of Stress : Not at all  ?Social Connections: Moderately Integrated  ? Frequency of Communication with Friends and Family: More than three times a week  ? Frequency of Social Gatherings with Friends and Family: More than three times a week  ? Attends Religious Services: 1 to 4 times per year  ? Active Member of Clubs or Organizations: Yes  ? Attends Banker Meetings: 1 to 4 times per year  ? Marital Status: Never married  ? ? ?Tobacco Counseling ?Counseling given: Not Answered ?Tobacco comments: occasional smoker 20+ years ago. Social smoker. ? ? ?Clinical Intake: ? ?Pre-visit preparation completed: Yes ? ?Pain : No/denies pain ? ?  ? ?Nutritional Risks: None ?Diabetes: No ? ?How often do you need to have someone help you when you read instructions, pamphlets, or other written materials from your doctor or pharmacy?: 1 - Never ?What is the last grade level you completed in school?: B.S. Degree from [employer] ? ?Diabetic? no ? ?Interpreter Needed?: No ? ?Information entered by :: Susie Cassette, LPN ? ? ?Activities of Daily Living ? ?  04/11/2022  ? 11:38 AM 12/18/2021  ?  8:02 AM  ?In your present state of health, do you have any difficulty performing the following activities:  ?Hearing? 0 0  ?Vision? 0 0  ?Difficulty concentrating or making  decisions? 0 0  ?Walking or climbing stairs? 0 0  ?Dressing or bathing? 0 0  ?Doing errands, shopping? 0 0  ?Preparing Food and eating ? N   ?Using the Toilet? N   ?In the past six months, have you accidently leaked urine? N   ?Do you have problems with loss of bowel control? N   ?Managing your Medications? N   ?Managing your Finances? N   ?Housekeeping or managing your Housekeeping? N   ? ? ?Patient Care Team: ?Etta Grandchild, MD as PCP - General ? ?Indicate any recent Medical Services you may have received from other than [facility] providers in the past year (date may be approximate). ? ?   ?Assessment:  ? This is a routine wellness  examination for Yvette Walton. ? ?Hearing/Vision screen ?Hearing Screening - Comments:: Patient denied any hearing difficulty.   ?Vision Screening - Comments:: No issues with vision. ? ?Dietary issues and exercise activities discussed: ?Current Exercise Habits: Home exercise routine, Type of exercise: walking, Time (Minutes): 30, Frequency (Times/Week): 5, Weekly Exercise (Minutes/Week): 150, Intensity: Moderate ? ? Goals Addressed   ? ?  ?  ?  ?  ? This Visit's Progress  ?  To maintain my current health status by continuing to eat healthy, stay physically active and socially active.     ? ?  ?Depression Screen ? ?  04/11/2022  ? 11:37 AM 12/18/2021  ?  8:08 AM 04/24/2020  ?  1:32 PM 07/07/2019  ?  4:44 PM 08/26/2017  ?  3:31 PM  ?PHQ 2/9 Scores  ?PHQ - 2 Score 0 0 0 0 0  ?  ?Fall Risk ? ?  04/11/2022  ? 11:37 AM 12/18/2021  ?  8:08 AM 04/24/2020  ?  1:32 PM 07/07/2019  ?  4:44 PM  ?Fall Risk   ?Falls in the past year? 0 0 0 0  ?Number falls in past yr: 0  0 0  ?Injury with Fall? 0  0 0  ?Risk for fall due to : No Fall Risks  No Fall Risks   ?Follow up Falls evaluation completed  Falls evaluation completed Falls evaluation completed  ? ? ?FALL RISK PREVENTION PERTAINING TO THE HOME: ? ?Any stairs in or around the home? Yes  ?If so, are there any without handrails? No  ?Home free of loose throw rugs in  walkways, pet beds, electrical cords, etc? Yes  ?Adequate lighting in your home to reduce risk of falls? Yes  ? ?ASSISTIVE DEVICES UTILIZED TO PREVENT FALLS: ? ?Life alert? No  ?Use of a cane, walker or

## 2022-04-13 ENCOUNTER — Other Ambulatory Visit: Payer: Self-pay | Admitting: Internal Medicine

## 2022-04-13 DIAGNOSIS — I10 Essential (primary) hypertension: Secondary | ICD-10-CM

## 2022-04-30 DIAGNOSIS — N3020 Other chronic cystitis without hematuria: Secondary | ICD-10-CM | POA: Diagnosis not present

## 2022-06-12 ENCOUNTER — Other Ambulatory Visit: Payer: Self-pay | Admitting: Internal Medicine

## 2022-06-12 DIAGNOSIS — E876 Hypokalemia: Secondary | ICD-10-CM

## 2022-06-12 DIAGNOSIS — I10 Essential (primary) hypertension: Secondary | ICD-10-CM

## 2022-06-17 ENCOUNTER — Ambulatory Visit (INDEPENDENT_AMBULATORY_CARE_PROVIDER_SITE_OTHER): Payer: Medicare Other | Admitting: Internal Medicine

## 2022-06-17 ENCOUNTER — Encounter: Payer: Self-pay | Admitting: Internal Medicine

## 2022-06-17 VITALS — BP 136/78 | HR 60 | Temp 97.9°F | Ht 65.5 in | Wt 141.0 lb

## 2022-06-17 DIAGNOSIS — I10 Essential (primary) hypertension: Secondary | ICD-10-CM | POA: Diagnosis not present

## 2022-06-17 DIAGNOSIS — F5104 Psychophysiologic insomnia: Secondary | ICD-10-CM

## 2022-06-17 DIAGNOSIS — M67431 Ganglion, right wrist: Secondary | ICD-10-CM | POA: Insufficient documentation

## 2022-06-17 LAB — CBC WITH DIFFERENTIAL/PLATELET
Basophils Absolute: 0.1 10*3/uL (ref 0.0–0.1)
Basophils Relative: 0.9 % (ref 0.0–3.0)
Eosinophils Absolute: 0.1 10*3/uL (ref 0.0–0.7)
Eosinophils Relative: 2 % (ref 0.0–5.0)
HCT: 39.2 % (ref 36.0–46.0)
Hemoglobin: 13 g/dL (ref 12.0–15.0)
Lymphocytes Relative: 19.5 % (ref 12.0–46.0)
Lymphs Abs: 1.2 10*3/uL (ref 0.7–4.0)
MCHC: 33.2 g/dL (ref 30.0–36.0)
MCV: 90.3 fl (ref 78.0–100.0)
Monocytes Absolute: 0.4 10*3/uL (ref 0.1–1.0)
Monocytes Relative: 7 % (ref 3.0–12.0)
Neutro Abs: 4.2 10*3/uL (ref 1.4–7.7)
Neutrophils Relative %: 70.6 % (ref 43.0–77.0)
Platelets: 215 10*3/uL (ref 150.0–400.0)
RBC: 4.34 Mil/uL (ref 3.87–5.11)
RDW: 13 % (ref 11.5–15.5)
WBC: 5.9 10*3/uL (ref 4.0–10.5)

## 2022-06-17 LAB — BASIC METABOLIC PANEL
BUN: 19 mg/dL (ref 6–23)
CO2: 29 mEq/L (ref 19–32)
Calcium: 9.9 mg/dL (ref 8.4–10.5)
Chloride: 99 mEq/L (ref 96–112)
Creatinine, Ser: 0.8 mg/dL (ref 0.40–1.20)
GFR: 76.87 mL/min (ref >60.00)
Glucose, Bld: 105 mg/dL — ABNORMAL HIGH (ref 70–99)
Potassium: 3.9 mEq/L (ref 3.5–5.1)
Sodium: 136 mEq/L (ref 135–145)

## 2022-06-17 LAB — URINALYSIS, ROUTINE W REFLEX MICROSCOPIC
Bilirubin Urine: NEGATIVE
Ketones, ur: NEGATIVE
Leukocytes,Ua: NEGATIVE
Nitrite: NEGATIVE
Specific Gravity, Urine: 1.015 (ref 1.000–1.030)
Total Protein, Urine: NEGATIVE
Urine Glucose: NEGATIVE
Urobilinogen, UA: 0.2 (ref 0.0–1.0)
pH: 5 (ref 5.0–8.0)

## 2022-06-17 MED ORDER — ESZOPICLONE 2 MG PO TABS: 2.0000 mg | ORAL_TABLET | Freq: Every evening | ORAL | 1 refills | Status: DC | PRN

## 2022-06-17 NOTE — Patient Instructions (Signed)
Hypertension, Adult High blood pressure (hypertension) is when the force of blood pumping through the arteries is too strong. The arteries are the blood vessels that carry blood from the heart throughout the body. Hypertension forces the heart to work harder to pump blood and may cause arteries to become narrow or stiff. Untreated or uncontrolled hypertension can lead to a heart attack, heart failure, a stroke, kidney disease, and other problems. A blood pressure reading consists of a higher number over a lower number. Ideally, your blood pressure should be below 120/80. The first ("top") number is called the systolic pressure. It is a measure of the pressure in your arteries as your heart beats. The second ("bottom") number is called the diastolic pressure. It is a measure of the pressure in your arteries as the heart relaxes. What are the causes? The exact cause of this condition is not known. There are some conditions that result in high blood pressure. What increases the risk? Certain factors may make you more likely to develop high blood pressure. Some of these risk factors are under your control, including: Smoking. Not getting enough exercise or physical activity. Being overweight. Having too much fat, sugar, calories, or salt (sodium) in your diet. Drinking too much alcohol. Other risk factors include: Having a personal history of heart disease, diabetes, high cholesterol, or kidney disease. Stress. Having a family history of high blood pressure and high cholesterol. Having obstructive sleep apnea. Age. The risk increases with age. What are the signs or symptoms? High blood pressure may not cause symptoms. Very high blood pressure (hypertensive crisis) may cause: Headache. Fast or irregular heartbeats (palpitations). Shortness of breath. Nosebleed. Nausea and vomiting. Vision changes. Severe chest pain, dizziness, and seizures. How is this diagnosed? This condition is diagnosed by  measuring your blood pressure while you are seated, with your arm resting on a flat surface, your legs uncrossed, and your feet flat on the floor. The cuff of the blood pressure monitor will be placed directly against the skin of your upper arm at the level of your heart. Blood pressure should be measured at least twice using the same arm. Certain conditions can cause a difference in blood pressure between your right and left arms. If you have a high blood pressure reading during one visit or you have normal blood pressure with other risk factors, you may be asked to: Return on a different day to have your blood pressure checked again. Monitor your blood pressure at home for 1 week or longer. If you are diagnosed with hypertension, you may have other blood or imaging tests to help your health care provider understand your overall risk for other conditions. How is this treated? This condition is treated by making healthy lifestyle changes, such as eating healthy foods, exercising more, and reducing your alcohol intake. You may be referred for counseling on a healthy diet and physical activity. Your health care provider may prescribe medicine if lifestyle changes are not enough to get your blood pressure under control and if: Your systolic blood pressure is above 130. Your diastolic blood pressure is above 80. Your personal target blood pressure may vary depending on your medical conditions, your age, and other factors. Follow these instructions at home: Eating and drinking  Eat a diet that is high in fiber and potassium, and low in sodium, added sugar, and fat. An example of this eating plan is called the DASH diet. DASH stands for Dietary Approaches to Stop Hypertension. To eat this way: Eat   plenty of fresh fruits and vegetables. Try to fill one half of your plate at each meal with fruits and vegetables. Eat whole grains, such as whole-wheat pasta, brown rice, or whole-grain bread. Fill about one  fourth of your plate with whole grains. Eat or drink low-fat dairy products, such as skim milk or low-fat yogurt. Avoid fatty cuts of meat, processed or cured meats, and poultry with skin. Fill about one fourth of your plate with lean proteins, such as fish, chicken without skin, beans, eggs, or tofu. Avoid pre-made and processed foods. These tend to be higher in sodium, added sugar, and fat. Reduce your daily sodium intake. Many people with hypertension should eat less than 1,500 mg of sodium a day. Do not drink alcohol if: Your health care provider tells you not to drink. You are pregnant, may be pregnant, or are planning to become pregnant. If you drink alcohol: Limit how much you have to: 0-1 drink a day for women. 0-2 drinks a day for men. Know how much alcohol is in your drink. In the [place] one drink equals one 12 oz bottle of beer (355 mL), one 5 oz glass of wine (148 mL), or one 1 oz glass of hard liquor (44 mL). Lifestyle  Work with your health care provider to maintain a healthy body weight or to lose weight. Ask what an ideal weight is for you. Get at least 30 minutes of exercise that causes your heart to beat faster (aerobic exercise) most days of the week. Activities may include walking, swimming, or biking. Include exercise to strengthen your muscles (resistance exercise), such as Pilates or lifting weights, as part of your weekly exercise routine. Try to do these types of exercises for 30 minutes at least 3 days a week. Do not use any products that contain nicotine or tobacco. These products include cigarettes, chewing tobacco, and vaping devices, such as e-cigarettes. If you need help quitting, ask your health care provider. Monitor your blood pressure at home as told by your health care provider. Keep all follow-up visits. This is important. Medicines Take over-the-counter and prescription medicines only as told by your health care provider. Follow directions carefully. Blood  pressure medicines must be taken as prescribed. Do not skip doses of blood pressure medicine. Doing this puts you at risk for problems and can make the medicine less effective. Ask your health care provider about side effects or reactions to medicines that you should watch for. Contact a health care provider if you: Think you are having a reaction to a medicine you are taking. Have headaches that keep coming back (recurring). Feel dizzy. Have swelling in your ankles. Have trouble with your vision. Get help right away if you: Develop a severe headache or confusion. Have unusual weakness or numbness. Feel faint. Have severe pain in your chest or abdomen. Vomit repeatedly. Have trouble breathing. These symptoms may be an emergency. Get help right away. Call 911. Do not wait to see if the symptoms will go away. Do not drive yourself to the hospital. Summary Hypertension is when the force of blood pumping through your arteries is too strong. If this condition is not controlled, it may put you at risk for serious complications. Your personal target blood pressure may vary depending on your medical conditions, your age, and other factors. For most people, a normal blood pressure is less than 120/80. Hypertension is treated with lifestyle changes, medicines, or a combination of both. Lifestyle changes include losing weight, eating a healthy,   low-sodium diet, exercising more, and limiting alcohol. This information is not intended to replace advice given to you by your health care provider. Make sure you discuss any questions you have with your health care provider. Document Revised: 09/24/2021 Document Reviewed: 09/24/2021 Elsevier Patient Education  2023 [employer] Inc[employer]  

## 2022-06-17 NOTE — Progress Notes (Signed)
Subjective:  Patient ID: Yvette Walton, female    DOB: 02/22/1956  Age: 66 y.o. MRN: [id number]  CC: Hypertension   HPI Yvette Walton presents for f/up -  She walks her dog about 35 minutes a day and does not experience chest pain, shortness of breath, edema, dizziness, or lightheadedness.  For the past 2 weeks she has noted a painless growth on her right wrist.  Outpatient Medications Prior to Visit  Medication Sig Dispense Refill   estradiol (ESTRACE) 0.1 MG/GM vaginal cream Place 1 Applicatorful vaginally at bedtime.     indapamide (LOZOL) 1.25 MG tablet TAKE 1 TABLET BY MOUTH EVERY DAY 30 tablet 5   irbesartan (AVAPRO) 150 MG tablet TAKE 1 TABLET BY MOUTH EVERY DAY 30 tablet 5   NON FORMULARY Green Miracle  1 scoop a day     potassium chloride (KLOR-CON) 8 MEQ tablet TAKE 2 TABLETS (16 MEQ TOTAL) BY MOUTH 2 (TWO) TIMES DAILY. 360 tablet 0   zinc gluconate 50 MG tablet Take 50 mg by mouth as needed.     eszopiclone (LUNESTA) 2 MG TABS tablet Take 1 tablet (2 mg total) by mouth at bedtime as needed for sleep. Take immediately before bedtime 90 tablet 1   nystatin ointment (MYCOSTATIN) Apply 1 application topically 2 (two) times daily. Apply to affected area for up to 7 days. 30 g 0   triamcinolone (KENALOG) 0.025 % ointment Apply 1 application topically 2 (two) times daily. Use for one week. 30 g 0   No facility-administered medications prior to visit.    ROS Review of Systems  Constitutional: Negative.  Negative for diaphoresis and fatigue.  HENT: Negative.    Eyes: Negative.   Respiratory:  Negative for cough, chest tightness, shortness of breath and wheezing.   Cardiovascular:  Negative for chest pain, palpitations and leg swelling.  Gastrointestinal:  Negative for abdominal pain, constipation, diarrhea, nausea and vomiting.  Endocrine: Negative.   Genitourinary: Negative.  Negative for difficulty urinating.  Musculoskeletal: Negative.   Skin: Negative.   Neurological:   Negative for dizziness and light-headedness.  Hematological:  Negative for adenopathy. Does not bruise/bleed easily.  Psychiatric/Behavioral:  Positive for sleep disturbance. Negative for decreased concentration and dysphoric mood. The patient is not nervous/anxious.     Objective:  BP 136/78 (BP Location: Left Arm, Patient Position: Sitting, Cuff Size: Large)   Pulse 60   Temp 97.9 F (36.6 C) (Oral)   Ht 5' 5.5" (1.664 m)   Wt 141 lb (64 kg)   SpO2 98%   BMI 23.11 kg/m   BP Readings from Last 3 Encounters:  06/17/22 136/78  04/01/22 130/78  03/06/22 130/80    Wt Readings from Last 3 Encounters:  06/17/22 141 lb (64 kg)  02/24/22 145 lb (65.8 kg)  01/09/22 146 lb (66.2 kg)    Physical Exam Vitals reviewed.  HENT:     Nose: Nose normal.     Mouth/Throat:     Mouth: Mucous membranes are moist.  Eyes:     General: No scleral icterus.    Conjunctiva/sclera: Conjunctivae normal.  Cardiovascular:     Rate and Rhythm: Normal rate and regular rhythm.     Heart sounds: No murmur heard. Pulmonary:     Effort: Pulmonary effort is normal.     Breath sounds: No stridor. No wheezing, rhonchi or rales.  Abdominal:     General: Abdomen is flat.     Palpations: There is no mass.     Tenderness:  There is no abdominal tenderness. There is no guarding.     Hernia: No hernia is present.  Musculoskeletal:       Arms:     Cervical back: Neck supple.     Right lower leg: No edema.     Left lower leg: No edema.  Lymphadenopathy:     Cervical: No cervical adenopathy.  Skin:    General: Skin is warm.     Findings: No lesion or rash.  Neurological:     General: No focal deficit present.     Mental Status: She is alert. Mental status is at baseline.  Psychiatric:        Mood and Affect: Mood normal.        Behavior: Behavior normal.     Lab Results  Component Value Date   WBC 5.9 06/17/2022   HGB 13.0 06/17/2022   HCT 39.2 06/17/2022   PLT 215.0 06/17/2022   GLUCOSE 105  (H) 06/17/2022   CHOL 179 12/18/2021   TRIG 106.0 12/18/2021   HDL 59.00 12/18/2021   LDLCALC 99 12/18/2021   ALT 13 12/18/2021   AST 17 12/18/2021   NA 136 06/17/2022   K 3.9 06/17/2022   CL 99 06/17/2022   CREATININE 0.80 06/17/2022   BUN 19 06/17/2022   CO2 29 06/17/2022   TSH 1.85 12/18/2021    No results found.  Assessment & Plan:   Yvette was seen today for hypertension.  Diagnoses and all orders for this visit:  Essential hypertension- Her blood pressure is well controlled.  Electrolytes and renal function are normal.  Will continue the combination of an ARB and thiazide diuretic. -     Basic metabolic panel; Future -     CBC with Differential/Platelet; Future -     Urinalysis, Routine w reflex microscopic; Future -     Urinalysis, Routine w reflex microscopic -     CBC with Differential/Platelet -     Basic metabolic panel  Ganglion cyst of dorsum of right wrist -     Ambulatory referral to Orthopedic Surgery  Psychophysiological insomnia -     eszopiclone (LUNESTA) 2 MG TABS tablet; Take 1 tablet (2 mg total) by mouth at bedtime as needed for sleep. Take immediately before bedtime   I have discontinued Yvette Dignan "Gail"'s nystatin ointment and triamcinolone. I am also having her maintain her NON FORMULARY, zinc gluconate, indapamide, estradiol, irbesartan, potassium chloride, and eszopiclone.  Meds ordered this encounter  Medications   eszopiclone (LUNESTA) 2 MG TABS tablet    Sig: Take 1 tablet (2 mg total) by mouth at bedtime as needed for sleep. Take immediately before bedtime    Dispense:  90 tablet    Refill:  1     Follow-up: Return in about 6 months (around 12/18/2022).  Sanda Linger, MD

## 2022-06-20 ENCOUNTER — Ambulatory Visit: Payer: Medicare Other | Admitting: Radiology

## 2022-06-20 VITALS — BP 124/84

## 2022-06-20 DIAGNOSIS — R350 Frequency of micturition: Secondary | ICD-10-CM

## 2022-06-20 DIAGNOSIS — N952 Postmenopausal atrophic vaginitis: Secondary | ICD-10-CM | POA: Diagnosis not present

## 2022-06-20 LAB — WET PREP FOR TRICH, YEAST, CLUE

## 2022-06-20 MED ORDER — ESTRADIOL 0.1 MG/GM VA CREA: 1.0000 g | TOPICAL_CREAM | VAGINAL | 4 refills | Status: DC

## 2022-06-20 NOTE — Progress Notes (Signed)
      Subjective: Yvette Walton is a 66 y.o. female who complains of vulvar burning, no vaginal discharge, urinary frequency after having intercourse and using ky jelly on 7/7. Has been using estrace cream , needs a refill.  - Sexually active with 1 female partner - Last sexual encounter: 14 days ago -Contraception: post menopausal  Review of Systems - negative except for above  Objective:  -Vulva: without lesions or discharge -Vagina: scant blood and discharge present,wet prep obtained. Moderate atrophy- improving -Perineum: no lesions  Urine dipstick shows positive for RBC's and positive for leukocytes.  Micro exam: 0-5 WBC's per HPF and 0-2 RBC's per HPF.  Microscopic wet-mount exam shows negative for pathogens, normal epithelial cells.   Chaperone offered and declined.  Assessment:/Plan:  1. Urinary frequency Reassured neg u/a - Urinalysis,Complete w/RFL Culture  2. Atrophic vaginitis Coconut oil or silicone based lubricant for intercourse, avoid KY - WET PREP FOR TRICH, YEAST, CLUE - estradiol (ESTRACE) 0.1 MG/GM vaginal cream; Place 1 g vaginally 3 (three) times a week.  Dispense: 42.5 g; Refill: 4      Will contact patient with results of testing completed today. Avoid intercourse until symptoms are resolved. Safe sex encouraged. Avoid the use of soaps or perfumed products in the peri area. Avoid tub baths and sitting in sweaty or wet clothing for prolonged periods of time.

## 2022-06-23 ENCOUNTER — Other Ambulatory Visit: Payer: Self-pay

## 2022-06-23 LAB — URINE CULTURE
MICRO NUMBER:: 13678579
SPECIMEN QUALITY:: ADEQUATE

## 2022-06-23 LAB — URINALYSIS, COMPLETE W/RFL CULTURE
Bilirubin Urine: NEGATIVE
Casts: NONE SEEN /LPF
Crystals: NONE SEEN /HPF
Glucose, UA: NEGATIVE
Hyaline Cast: NONE SEEN /LPF
Ketones, ur: NEGATIVE
Nitrites, Initial: NEGATIVE
Protein, ur: NEGATIVE
Specific Gravity, Urine: 1.02 (ref 1.001–1.035)
Yeast: NONE SEEN /HPF
pH: 5 (ref 5.0–8.0)

## 2022-06-23 LAB — CULTURE INDICATED

## 2022-06-23 MED ORDER — NITROFURANTOIN MONOHYD MACRO 100 MG PO CAPS: 100.0000 mg | ORAL_CAPSULE | Freq: Two times a day (BID) | ORAL | 0 refills | Status: DC

## 2022-06-26 ENCOUNTER — Ambulatory Visit (INDEPENDENT_AMBULATORY_CARE_PROVIDER_SITE_OTHER): Payer: Medicare Other | Admitting: Orthopaedic Surgery

## 2022-06-26 DIAGNOSIS — M67431 Ganglion, right wrist: Secondary | ICD-10-CM

## 2022-06-26 NOTE — Progress Notes (Signed)
Office Visit Note   Patient: Yvette Walton           Date of Birth: [birth year]           MRN: [id number] Visit Date: 06/26/2022              Requested by: Etta Grandchild, MD [address] [place],  [place] [address] PCP: Etta Grandchild, MD   Assessment & Plan: Visit Diagnoses:  1. Ganglion cyst of dorsum of right wrist     Plan: Impression is small right wrist mass likely ganglion cyst.  Based on options we decided to just monitor for now.  She may consider picking up wrist brace to immobilize temporarily to see if this will resolve it.  Otherwise we will see her back as needed.  Follow-Up Instructions: No follow-ups on file.   Orders:  No orders of the defined types were placed in this encounter.  No orders of the defined types were placed in this encounter.     Procedures: No procedures performed   Clinical Data: No additional findings.   Subjective: Chief Complaint  Patient presents with   Right Wrist - Cyst    HPI Yvette Walton is a very pleasant 66 year old female who comes in as a referral from Dr. Yetta Barre for small wrist ganglion cyst that developed about 5 to 6 weeks ago.  Denies any changes in activity or injury.  Denies any numbness and tingling.  Has no symptoms from this. Review of Systems  Constitutional: Negative.   HENT: Negative.    Eyes: Negative.   Respiratory: Negative.    Cardiovascular: Negative.   Endocrine: Negative.   Musculoskeletal: Negative.   Neurological: Negative.   Hematological: Negative.   Psychiatric/Behavioral: Negative.    All other systems reviewed and are negative.    Objective: Vital Signs: There were no vitals taken for this visit.  Physical Exam Vitals and nursing note reviewed.  Constitutional:      Appearance: She is well-developed.  HENT:     Head: Atraumatic.     Nose: Nose normal.  Eyes:     Extraocular Movements: Extraocular movements intact.  Cardiovascular:     Pulses: Normal pulses.  Pulmonary:      Effort: Pulmonary effort is normal.  Abdominal:     Palpations: Abdomen is soft.  Musculoskeletal:     Cervical back: Neck supple.  Skin:    General: Skin is warm.     Capillary Refill: Capillary refill takes less than 2 seconds.  Neurological:     Mental Status: She is alert. Mental status is at baseline.  Psychiatric:        Behavior: Behavior normal.        Thought Content: Thought content normal.        Judgment: Judgment normal.     Ortho Exam Examination of right wrist shows pea-sized dorsal ganglion cyst just distal to the ulnar styloid in line with the ECU tendon.  No aggressive features.  No tenderness to palpation.  She has full wrist and hand function. Specialty Comments:  No specialty comments available.  Imaging: No results found.   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   Past Medical History:  Diagnosis Date   Allergic rhinitis    Asthma    Hypertension     Family History  Problem Relation Age of Onset   Coronary artery disease Mother    Coronary artery disease Father    Prostate cancer Father    Diabetes Father        AOD   Colon cancer Neg Hx    Esophageal cancer Neg Hx    Rectal cancer Neg Hx    Stomach cancer Neg Hx     Past Surgical History:  Procedure Laterality Date   NASAL SEPTUM SURGERY     TONSILLECTOMY     Social History   Occupational History   Occupation: Works full time    Associate Professor: [employer]  Tobacco Use   Smoking  status: Never    Passive exposure: Past   Smokeless tobacco: Never   Tobacco comments:    occasional smoker 20+ years ago. Social smoker.  Vaping Use   Vaping Use: Never used  Substance and Sexual Activity   Alcohol use: Yes    Alcohol/week: 2.0 standard drinks of alcohol    Types: 2 Glasses of wine per week   Drug use: No   Sexual activity: Yes    Birth control/protection: Post-menopausal

## 2022-07-03 ENCOUNTER — Ambulatory Visit: Payer: Medicare Other | Admitting: Radiology

## 2022-07-03 VITALS — BP 114/68

## 2022-07-03 DIAGNOSIS — R350 Frequency of micturition: Secondary | ICD-10-CM | POA: Diagnosis not present

## 2022-07-03 LAB — URINALYSIS, COMPLETE
Bacteria, UA: NONE SEEN /HPF
Glucose, UA: NEGATIVE
Hgb urine dipstick: NEGATIVE
Leukocytes,Ua: NEGATIVE
Nitrite: NEGATIVE
Protein, ur: NEGATIVE
RBC / HPF: NONE SEEN /HPF (ref 0–2)
Specific Gravity, Urine: 1.025 (ref 1.001–1.035)
WBC, UA: NONE SEEN /HPF (ref 0–5)
pH: 5.5 (ref 5.0–8.0)

## 2022-07-03 NOTE — Progress Notes (Signed)
      SUBJECTIVE: Yvette Walton is a 66 y.o. female who complains of frequent urination, urgency, no dysuria, no vaginal discharge (finished Macrobid 07/01/22) Using estrace as directed. Has not had intercourse x 3 weeks.   Past Medical History:  Diagnosis Date   Allergic rhinitis    Asthma    Hypertension        07/03/2022    2:26 PM 06/20/2022    2:47 PM 06/17/2022   10:47 AM  Vitals with BMI  Height   5' 5.5"  Weight   141 lbs  BMI   23.1  Systolic 114 124 824  Diastolic 68 84 78  Pulse   60    OBJECTIVE: Appears well, in no apparent distress.  Vital signs are normal. The abdomen is soft without tenderness, guarding, mass, rebound or organomegaly. No CVA tenderness or inguinal adenopathy noted. Urine dipstick shows positive for urobilinogen and positive for ketones.  Micro exam: hyaline casts seen.   Chaperone offered and declined for exam.  ASSESSMENT/PLAN:  1. Urinary frequency  - Urinalysis, Complete - Urine Culture   Keep appt with Urology in 3 weeks. Will send urine culture and sensitivity.  Treatment per orders - also push fluids, avoid bladder irritants. Instructed she may use Pyridium OTC prn. Call or return to clinic prn if these symptoms worsen or fail to improve as anticipated. Pyelo precautions reviewed with patient.

## 2022-07-04 LAB — URINE CULTURE
MICRO NUMBER:: 13732114
Result:: NO GROWTH
SPECIMEN QUALITY:: ADEQUATE

## 2022-07-30 DIAGNOSIS — N3020 Other chronic cystitis without hematuria: Secondary | ICD-10-CM | POA: Diagnosis not present

## 2022-08-07 ENCOUNTER — Ambulatory Visit
Admission: RE | Admit: 2022-08-07 | Discharge: 2022-08-07 | Disposition: A | Discharge status: Home / Self Care | Payer: Medicare Other | Source: Ambulatory Visit | Attending: Obstetrics and Gynecology | Admitting: Obstetrics and Gynecology

## 2022-08-07 DIAGNOSIS — M85832 Other specified disorders of bone density and structure, left forearm: Secondary | ICD-10-CM | POA: Diagnosis not present

## 2022-08-07 DIAGNOSIS — Z780 Asymptomatic menopausal state: Secondary | ICD-10-CM | POA: Diagnosis not present

## 2022-08-07 DIAGNOSIS — M810 Age-related osteoporosis without current pathological fracture: Secondary | ICD-10-CM | POA: Diagnosis not present

## 2022-08-12 ENCOUNTER — Other Ambulatory Visit: Payer: Self-pay | Admitting: Internal Medicine

## 2022-08-12 DIAGNOSIS — I10 Essential (primary) hypertension: Secondary | ICD-10-CM

## 2022-08-13 ENCOUNTER — Other Ambulatory Visit: Payer: Medicare Other

## 2022-08-21 ENCOUNTER — Encounter: Payer: Self-pay | Admitting: Obstetrics and Gynecology

## 2022-08-21 ENCOUNTER — Encounter: Payer: Self-pay | Admitting: Internal Medicine

## 2022-08-23 IMAGING — MG MM DIGITAL SCREENING BILAT W/ TOMO AND CAD
8 series · 8 of 24 positions shown · non-contrast
Comparison: Previous exam(s).

CLINICAL DATA: Screening.

EXAM:
DIGITAL SCREENING BILATERAL MAMMOGRAM WITH TOMOSYNTHESIS AND CAD
TECHNIQUE: Bilateral screening digital craniocaudal and mediolateral oblique
mammograms were obtained. Bilateral screening digital breast
tomosynthesis was performed. The images were evaluated with
computer-aided detection.

[R CC synth-2D]
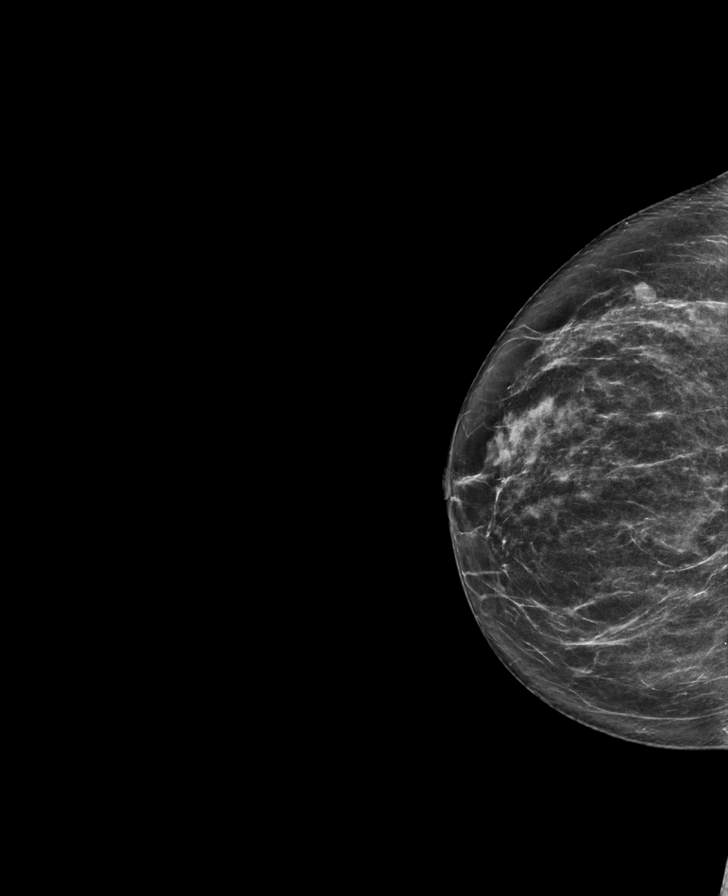

[L CC synth-2D]
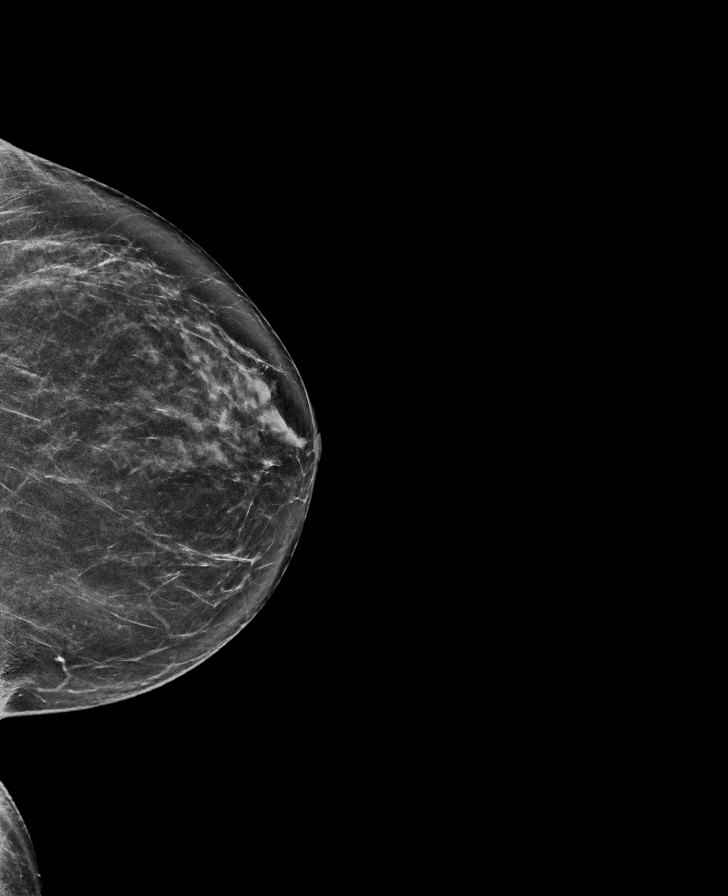

[L MLO synth-2D]
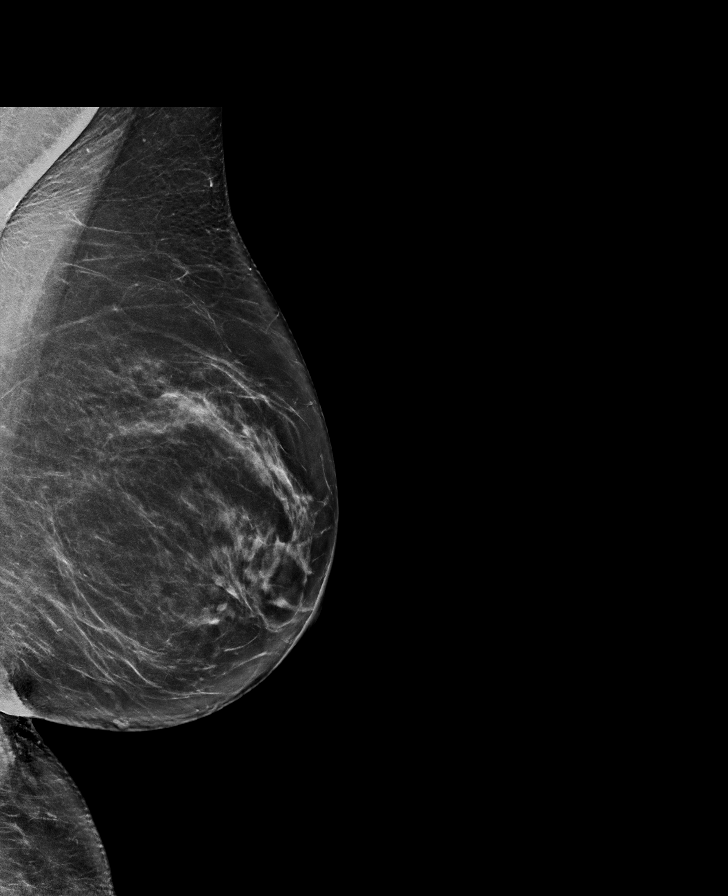

[R MLO synth-2D]
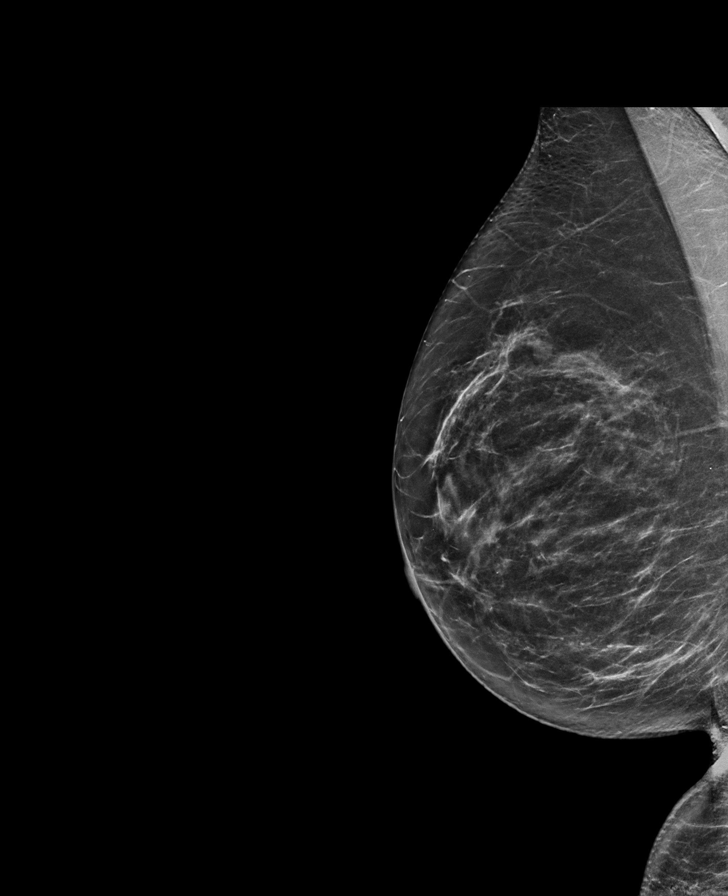

[L CC tomo · tomo slice 37/72.0]
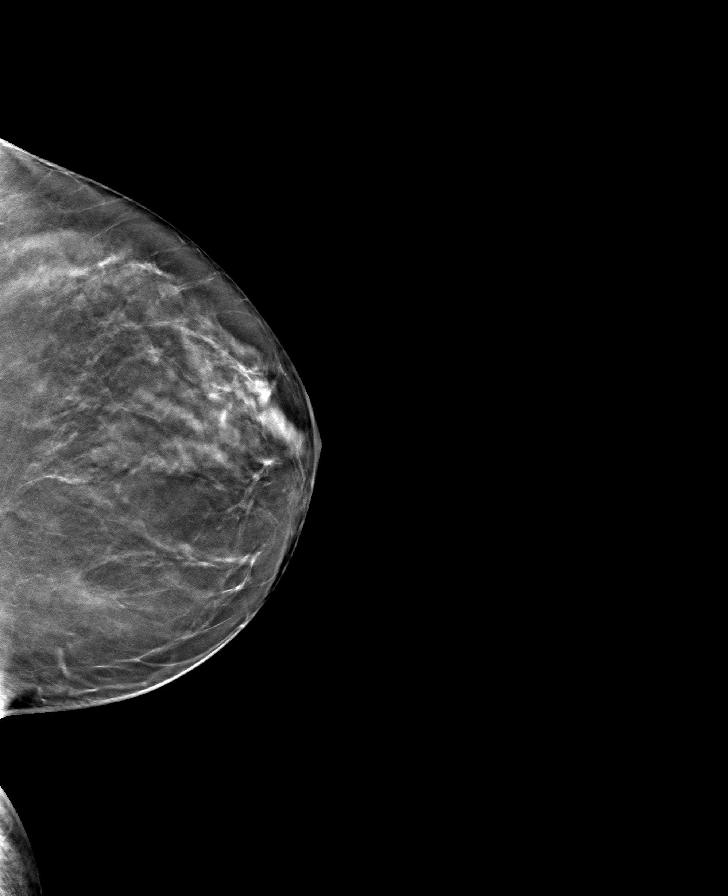

[L MLO tomo · tomo slice 39/77.0]
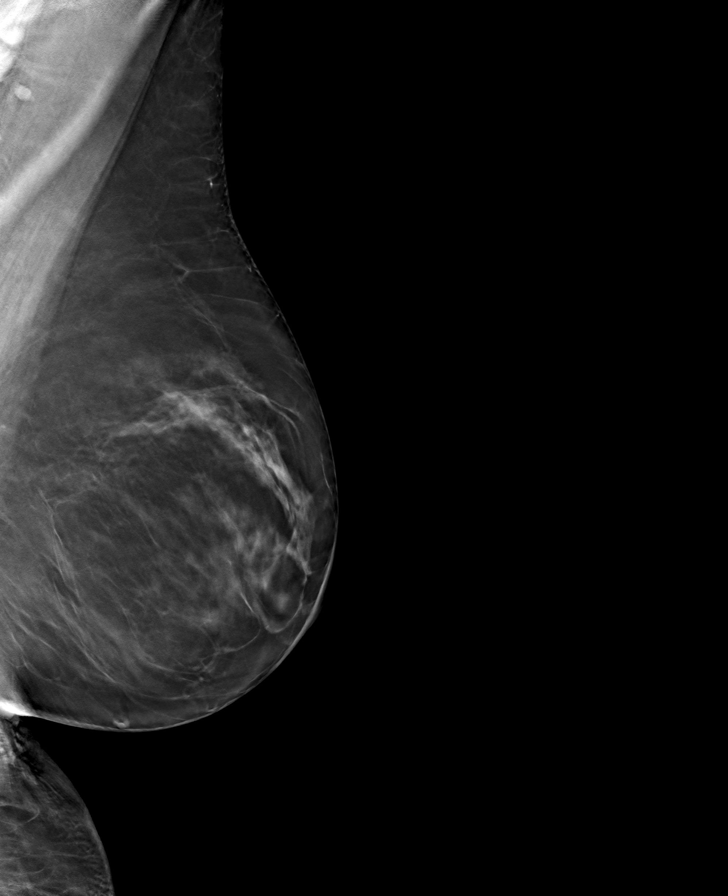

[R CC tomo · tomo slice 36/71.0]
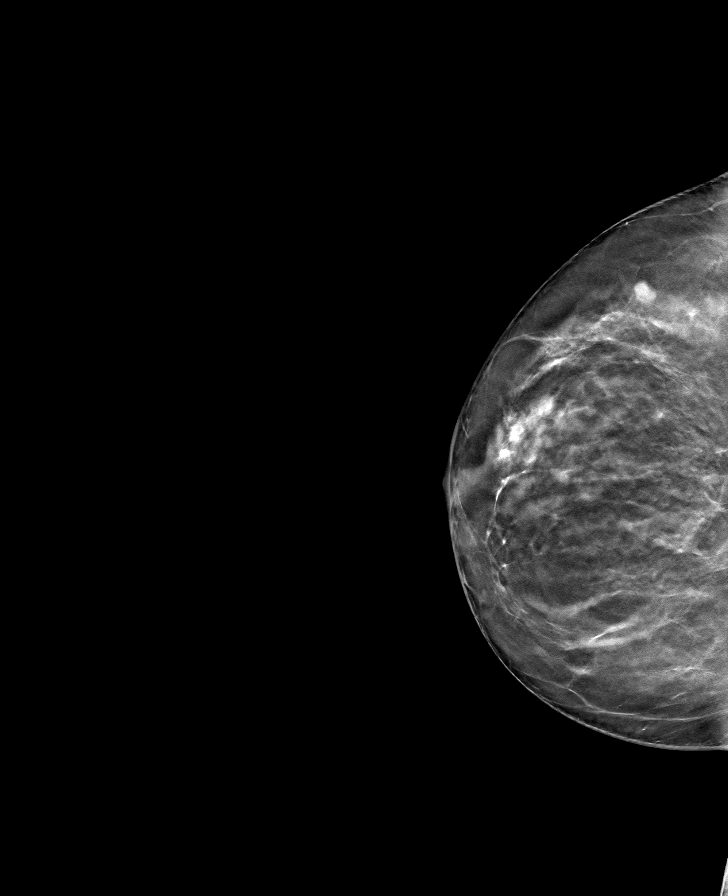

[R MLO tomo · tomo slice 36/71.0]
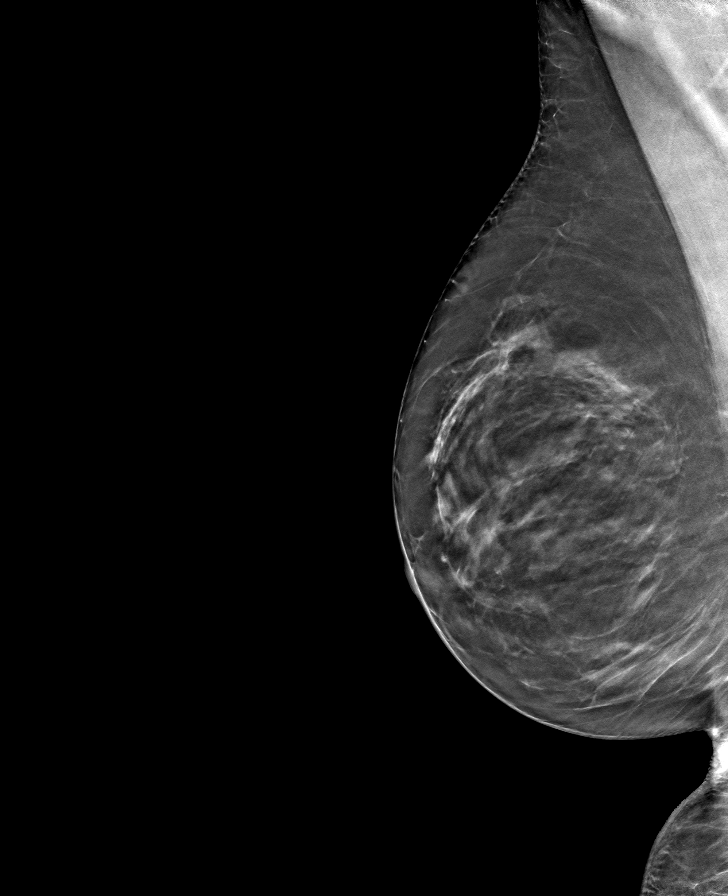

[8 of 24 positions shown; findings below may reference images not displayed]

ACR Breast Density Category b: There are scattered areas of
fibroglandular density.
FINDINGS: There are no findings suspicious for malignancy.
IMPRESSION: No mammographic evidence of malignancy. A result letter of this
screening mammogram will be mailed directly to the patient.

RECOMMENDATION:
Screening mammogram in one year. (Code:51-O-LD2)

BI-RADS CATEGORY  1: Negative.

## 2022-08-27 LAB — HM DEXA SCAN: HM Dexa Scan: -2.8

## 2022-08-28 NOTE — Telephone Encounter (Signed)
Patient needs OV with Dr.Silva to discuss dexa results.

## 2022-08-28 NOTE — Progress Notes (Signed)
GYNECOLOGY  VISIT   HPI: 66 y.o.   Single  Caucasian  female   G0P0000 with No LMP recorded. Patient is postmenopausal.   here for consult to discuss osteoporosis of the hip & spine. Bone density done 08-07-22    Narrative & Impression  EXAM: DUAL X-RAY ABSORPTIOMETRY (DXA) FOR BONE MINERAL DENSITY 08/07/2022 11:26 am   CLINICAL DATA:  66 year old Female Postmenopausal. menopause   TECHNIQUE: An axial (e.g., hips, spine) and/or appendicular (e.g., radius) exam was performed, as appropriate, using Haematologist at [facility]. Images are obtained for bone mineral density measurement and are not obtained for diagnostic purposes. [id number]   Exclusions: Lumbar L1 and L2 due to greater than 1 T-score standard deviation.   COMPARISON:  None.   FINDINGS: Scan quality: Good.   LUMBAR SPINE (L3-L4):   BMD (in g/cm2): 0.797   T-score: -2.8   Z-score: -0.8   LEFT FEMORAL NECK:   BMD (in g/cm2): 0.569   T-score: -2.5   Z-score: -0.9   LEFT TOTAL HIP:   BMD (in g/cm2): 0.736   T-score: -1.7   Z-score: -0.4   LEFT FOREARM (RADIUS 33%):   BMD (in g/cm2): 0.596   T-score: -1.6   Z-score: 0.1   FRAX 10-YEAR PROBABILITY OF FRACTURE:   Patient does not meet criteria for FRAX assessment.   IMPRESSION: Osteoporosis based on BMD.   Fracture risk is increased. Increased risk is based on low BMD.   RECOMMENDATIONS: 1. All patients should optimize calcium and vitamin D intake.   2. Consider FDA-approved medical therapies in postmenopausal women and men aged 80 years and older, based on the following:   - A hip or vertebral (clinical or morphometric) fracture   - T-score less than or equal to -2.5 and secondary causes have been excluded.   - Low bone mass (T-score between -1.0 and -2.5) and a 10-year probability of a hip fracture greater than or equal to 3% or a 10-year probability of a major osteoporosis-related fracture  greater than or equal to 20% based on the [place]-adapted WHO algorithm.   - Clinician judgment and/or patient preferences may indicate treatment for people with 10-year fracture probabilities above or below these levels   3. Patients with diagnosis of osteoporosis or at high risk for fracture should have regular bone mineral density tests. For patients eligible for Medicare, routine testing is allowed once every 2 years. The testing frequency can be increased to one year for patients who have rapidly progressing disease, those who are receiving or discontinuing medical therapy to restore bone mass, or have additional risk factors.     Electronically Signed   By: Frederico Hamman M.D.   On: 08/27/2022 11:17    Previous bone density 2015 showed T score of spine -2.1 and T score of left hip -1.8.  No prior fracture.  Not a smoker.   ETOH use:  2 times per week.  No colitis.  Taking probiotics with D mannose and cranberry.  No current steroid use.  Many years ago used prednisone and steroid inhalers.  No thyroid disease.   No reflux disease.  No dental implants or mouth surgery planned.  Mother had bone mass problem, but patient is uncertain of her diagnosis.   Calcium: 1300 mg total daily Vit D: 2000 IU total daily Exercising: going to gym 1 - 2 times per week usually  GYNECOLOGIC HISTORY: No LMP recorded. Patient is postmenopausal. Contraception:  pmp Menopausal hormone therapy:  estradiol vaginal cream Last mammogram:  02-06-22 category b density birads 1:neg Last pap smear:   02-24-22 neg        OB History     Gravida  0   Para  0   Term  0   Preterm  0   AB  0   Living  0      SAB  0   IAB  0   Ectopic  0   Multiple  0   Live Births  0              Patient Active Problem List   Diagnosis Date Noted   Age-related osteoporosis without current pathological fracture 09/02/2022   Ganglion cyst of dorsum of right wrist 06/17/2022   Dyspareunia,  female 12/18/2021   Dyslipidemia, goal LDL below 130 12/18/2021   Encounter for general adult medical examination with abnormal findings 12/03/2020   Psychophysiological insomnia 12/03/2020   Diuretic-induced hypokalemia 07/18/2019   Essential hypertension 07/07/2019   Asthma with acute exacerbation 03/27/2014   ALLERGIC RHINITIS 12/29/2008   Allergic-infective asthma 12/29/2008    Past Medical History:  Diagnosis Date   Allergic rhinitis    Asthma    Hypertension    Osteoporosis 2023   spine and hip    Past Surgical History:  Procedure Laterality Date   NASAL SEPTUM SURGERY     TONSILLECTOMY      Current Outpatient Medications  Medication Sig Dispense Refill   Cephalexin 250 MG tablet      clobetasol ointment (TEMOVATE) 0.05 % SMARTSIG:Topical 2-3 Times Weekly     estradiol (ESTRACE) 0.1 MG/GM vaginal cream Place 1 g vaginally 3 (three) times a week. 42.5 g 4   eszopiclone (LUNESTA) 2 MG TABS tablet Take 1 tablet (2 mg total) by mouth at bedtime as needed for sleep. Take immediately before bedtime 90 tablet 1   indapamide (LOZOL) 1.25 MG tablet TAKE 1 TABLET BY MOUTH EVERY DAY 90 tablet 1   irbesartan (AVAPRO) 150 MG tablet TAKE 1 TABLET BY MOUTH EVERY DAY 30 tablet 5   Multiple Minerals-Vitamins (CITRACAL MAXIMUM PLUS) TABS      nitrofurantoin, macrocrystal-monohydrate, (MACROBID) 100 MG capsule Take 1 capsule (100 mg total) by mouth 2 (two) times daily. 14 capsule 0   NON FORMULARY Green Miracle  1 scoop a day     NON FORMULARY 5 billion CFU 6 strains (vegan based probiotic)     potassium chloride (KLOR-CON) 8 MEQ tablet TAKE 2 TABLETS (16 MEQ TOTAL) BY MOUTH 2 (TWO) TIMES DAILY. 360 tablet 0   zinc gluconate 50 MG tablet Take 50 mg by mouth as needed.     No current facility-administered medications for this visit.     ALLERGIES: Patient has no active allergies.  Family History  Problem Relation Age of Onset   Coronary artery disease Mother    Coronary artery  disease Father    Prostate cancer Father    Diabetes Father        AOD   Colon cancer Neg Hx    Esophageal cancer Neg Hx    Rectal cancer Neg Hx    Stomach cancer Neg Hx     Social History   Socioeconomic History   Marital status: Single    Spouse name: Not on file   Number of children: Not on file   Years of education: Not on file   Highest education level: Not on file  Occupational History   Occupation: Works full  time    Employer: [employer]  Tobacco Use   Smoking status: Never    Passive exposure: Past   Smokeless tobacco: Never   Tobacco comments:    occasional smoker 20+ years ago. Social smoker.  Vaping Use   Vaping Use: Never used  Substance and Sexual Activity   Alcohol use: Yes    Alcohol/week: 2.0 standard drinks of alcohol    Types: 2 Glasses of wine per week   Drug use: No   Sexual activity: Yes    Birth control/protection: Post-menopausal  Other Topics Concern   Not on file  Social History Narrative   Not on file   Social Determinants of Health   Financial Resource Strain: Low Risk  (04/11/2022)   Overall Financial Resource Strain (CARDIA)    Difficulty of Paying Living Expenses: Not hard at all  Food Insecurity: No Food Insecurity (04/11/2022)   Hunger Vital Sign    Worried About Running Out of Food in the Last Year: Never true    Ran Out of Food in the Last Year: Never true  Transportation Needs: No Transportation Needs (04/11/2022)   PRAPARE - Hydrologist (Medical): No    Lack of Transportation (Non-Medical): No  Physical Activity: Sufficiently Active (04/11/2022)   Exercise Vital Sign    Days of Exercise per Week: 5 days    Minutes of Exercise per Session: 30 min  Stress: No Stress Concern Present (04/11/2022)   [place]    Feeling of Stress : Not at all  Social Connections: Moderately Integrated (04/11/2022)   Social Connection and  Isolation Panel [NHANES]    Frequency of Communication with Friends and Family: More than three times a week    Frequency of Social Gatherings with Friends and Family: More than three times a week    Attends Religious Services: 1 to 4 times per year    Active Member of [employer] or Organizations: Yes    Attends Archivist Meetings: 1 to 4 times per year    Marital Status: Never married  Intimate Partner Violence: Not At Risk (04/11/2022)   Humiliation, Afraid, Rape, and Kick questionnaire    Fear of Current or Ex-Partner: No    Emotionally Abused: No    Physically Abused: No    Sexually Abused: No    Review of Systems  All other systems reviewed and are negative.   PHYSICAL EXAMINATION:    BP 122/78   Pulse (!) 47   SpO2 98%     General appearance: alert, cooperative and appears stated age   ASSESSMENT  Osteoporosis with no current fracture.   PLAN  Bone density report discussed.  Counseled regarding osteoporosis, risk factors, treatment options (Fosamax, Actonel, Reclast, Evista, Prolia, Forteo), and fall risk reduction. Patient would like to consider choices. Reading materials provided. Discussed importance of weight bearing exercise and daily calcium 1200 mg daily and vit D.  Will check PTH, calcium, vit D level. Remaining labs are in Epic. Patient does understand that osteoporosis is a silent disease until fracture occurs and understands that there is significant morbidity associated with osteoporotic fracture.  Will need follow up BMD in 2 years. Final plan to follow.    An After Visit Summary was printed and given to the patient.  37 min  total time was spent for this patient encounter, including preparation, face-to-face counseling with the patient, coordination of care, and documentation of  the encounter.

## 2022-09-01 ENCOUNTER — Encounter: Payer: Self-pay | Admitting: Obstetrics and Gynecology

## 2022-09-02 ENCOUNTER — Encounter: Payer: Self-pay | Admitting: Obstetrics and Gynecology

## 2022-09-02 ENCOUNTER — Ambulatory Visit: Payer: Medicare Other | Admitting: Obstetrics and Gynecology

## 2022-09-02 VITALS — BP 122/78 | HR 47

## 2022-09-02 DIAGNOSIS — M810 Age-related osteoporosis without current pathological fracture: Secondary | ICD-10-CM | POA: Diagnosis not present

## 2022-09-02 DIAGNOSIS — R7989 Other specified abnormal findings of blood chemistry: Secondary | ICD-10-CM | POA: Diagnosis not present

## 2022-09-02 NOTE — Patient Instructions (Signed)
Zoledronic Acid Injection (Bone Disorders) What is this medication? ZOLEDRONIC ACID (ZOE le dron ik AS id) prevents and treats osteoporosis. It may also be used to treat Paget's disease of the bone. It works by Paramedic stronger and less likely to break (fracture). It belongs to a group of medications called bisphosphonates. This medicine may be used for other purposes; ask your health care provider or pharmacist if you have questions. COMMON BRAND NAME(S): Reclast What should I tell my care team before I take this medication? They need to know if you have any of these conditions: Bleeding disorder Cancer Dental disease Kidney disease Low levels of calcium in the blood Low red blood cell counts Lung or breathing disease, such as asthma Receiving steroids, such as dexamethasone or prednisone An unusual or allergic reaction to zoledronic acid, other medications, foods, dyes, or preservatives Pregnant or trying to get pregnant Breast-feeding How should I use this medication? This medication is injected into a vein. It is given by your care team in a hospital or clinic setting. A special MedGuide will be given to you before each treatment. Be sure to read this information carefully each time. Talk to your care team about the use of this medication in children. Special care may be needed. Overdosage: If you think you have taken too much of this medicine contact a poison control center or emergency room at once. NOTE: This medicine is only for you. Do not share this medicine with others. What if I miss a dose? Keep appointments for follow-up doses. It is important not to miss your dose. Call your care team if you are unable to keep an appointment. What may interact with this medication? Certain antibiotics given by injection Medications for pain and inflammation, such as ibuprofen, naproxen, NSAIDs Some diuretics, such as bumetanide, furosemide Teriparatide This list may not  describe all possible interactions. Give your health care provider a list of all the medicines, herbs, non-prescription drugs, or dietary supplements you use. Also tell them if you smoke, drink alcohol, or use illegal drugs. Some items may interact with your medicine. What should I watch for while using this medication? Visit your care team for regular checks on your progress. It may be some time before you see the benefit from this medication. Some people who take this medication have severe bone, joint, or muscle pain. This medication may also increase your risk for jaw problems or a broken thigh bone. Tell your care team right away if you have severe pain in your jaw, bones, joints, or muscles. Tell your care team if you have any pain that does not go away or that gets worse. You should make sure you get enough calcium and vitamin D while you are taking this medication. Discuss the foods you eat and the vitamins you take with your care team. You may need bloodwork while taking this medication. Tell your dentist and dental surgeon that you are taking this medication. You should not have major dental surgery while on this medication. See your dentist to have a dental exam and fix any dental problems before starting this medication. Take good care of your teeth while on this medication. Make sure you see your dentist for regular follow-up appointments. What side effects may I notice from receiving this medication? Side effects that you should report to your care team as soon as possible: Allergic reactions--skin rash, itching, hives, swelling of the face, lips, tongue, or throat Kidney injury--decrease in the amount of urine,  swelling of the ankles, hands, or feet Low calcium level--muscle pain or cramps, confusion, tingling, or numbness in the hands or feet Osteonecrosis of the jaw--pain, swelling, or redness in the mouth, numbness of the jaw, poor healing after dental work, unusual discharge from the  mouth, visible bones in the mouth Severe bone, joint, or muscle pain Side effects that usually do not require medical attention (report to your care team if they continue or are bothersome): Diarrhea Dizziness Headache Nausea Stomach pain Vomiting This list may not describe all possible side effects. Call your doctor for medical advice about side effects. You may report side effects to FDA at 1-800-FDA-1088. Where should I keep my medication? This medication is given in a hospital or clinic. It will not be stored at home. NOTE: This sheet is a summary. It may not cover all possible information. If you have questions about this medicine, talk to your doctor, pharmacist, or health care provider.  2023 Elsevier/Gold Standard (2008-01-08 00:00:00)   Risedronate Tablets What is this medication? RISEDRONATE (ris ED roe nate) prevents and treats osteoporosis. It may also be used to treat Paget's disease of the bone. It works by Paramedic stronger and less likely to break (fracture). It belongs to a group of medications called bisphosphonates. This medicine may be used for other purposes; ask your health care provider or pharmacist if you have questions. COMMON BRAND NAME(S): Actonel What should I tell my care team before I take this medication? They need to know if you have any of these conditions: Bleeding disorder Cancer Dental disease Difficulty swallowing Infection Kidney disease Low levels of calcium in the blood Low red blood cell levels Stomach or intestine problems Taking steroids, such as dexamethasone or prednisone Trouble sitting or standing for 30 minutes An unusual or allergic reaction to risedronate, other medications, foods, dyes, or preservatives Pregnant or trying to get pregnant Breast-feeding How should I use this medication? Take this medication by mouth with a full glass of water. Take it as directed on the prescription label. If you take it once a day,  take it at the same time every day. If you take it once a month, take it on the same day of each month. Take the dose right after waking up. Do not eat or drink anything before taking it. Do not take it with any other drink except water. Do not chew or crush the tablet. After taking it, do not eat breakfast, drink, or take any other medications or vitamins for at least 30 minutes. Sit or stand up for at least 30 minutes after you take it. Do not lie down. Keep taking it unless your care team tells you to stop. A special MedGuide will be given to you by the pharmacist with each prescription and refill. Be sure to read this information carefully each time. Talk to your care team about the use of this medication in children. Special care may be needed. Overdosage: If you think you have taken too much of this medicine contact a poison control center or emergency room at once. NOTE: This medicine is only for you. Do not share this medicine with others. What if I miss a dose? If you take your medication once a day, skip it. Take your next dose at the scheduled time the next morning. Do not take two doses on the same day. If you take your medication once a month and the next scheduled dose is more than 7 days away, take the  missed dose on the morning after you remember. Do not take two doses on the same day. If you take your medication once a month and the next scheduled dose is only 1 to 7 days away, skip it. Take the next dose on the morning of the next scheduled dose. Do not take two doses on the same day. What may interact with this medication? Aluminum hydroxide Antacids Aspirin Calcium supplements Iron supplements Magnesium supplements NSAIDS, medications for pain and inflammation, such as ibuprofen or naproxen Stomach acid blockers, such as cimetidine, famotidine, omeprazole Vitamins with minerals This list may not describe all possible interactions. Give your health care provider a list of all  the medicines, herbs, non-prescription drugs, or dietary supplements you use. Also tell them if you smoke, drink alcohol, or use illegal drugs. Some items may interact with your medicine. What should I watch for while using this medication? Visit your care team for regular checks on your progress. It may be some time before you see the benefit from this medication. Some people who take this medication have severe bone, joint, or muscle pain. This medication may also increase your risk for jaw problems or a broken thigh bone. Tell your care team right away if you have severe pain in your jaw, bones, joints, or muscles. Tell your care team if you have any pain that does not go away or that gets worse. You should make sure you get enough calcium and vitamin D while you are taking this medication. Discuss the foods you eat and the vitamins you take with your care team. Tell your dentist and dental surgeon that you are taking this medication. You should not have major dental surgery while on this medication. See your dentist to have a dental exam and fix any dental problems before starting this medication. Take good care of your teeth while on this medication. Make sure you see your dentist for regular follow-up appointments. What side effects may I notice from receiving this medication? Side effects that you should report to your care team as soon as possible: Allergic reactions--skin rash, itching, hives, swelling of the face, lips, tongue, or throat Low calcium level--muscle pain or cramps, confusion, tingling, or numbness in the hands or feet Osteonecrosis of the jaw--pain, swelling, or redness in the mouth, numbness of the jaw, poor healing after dental work, unusual discharge from the mouth, visible bones in the mouth Pain or trouble swallowing Severe bone, joint, or muscle pain Stomach bleeding--bloody or black, tar-like stools, vomiting blood or brown material that looks like coffee grounds Side  effects that usually do not require medical attention (report to your care team if they continue or are bothersome): Back pain Joint pain Stomach pain Upset stomach This list may not describe all possible side effects. Call your doctor for medical advice about side effects. You may report side effects to FDA at 1-800-FDA-1088. Where should I keep my medication? Keep out of the reach of children and pets. Store at room temperature between 20 and 25 degrees C (68 and 77 degrees F). Get rid of any unused medication after the expiration date. To get rid of medications that are no longer needed or have expired: Take the medication to a take-back program. Check with your pharmacy or law enforcement to find a location. If you cannot return the medication, check the label or package insert to see if the medication should be thrown out in the garbage or flushed down the toilet. If you are not sure,  ask your care team. If it is safe to put it in the trash, empty the medication out of the container. Mix the medication with cat litter, dirt, coffee grounds, or other unwanted substance. Seal the mixture in a bag or container. Put it in the trash. NOTE: This sheet is a summary. It may not cover all possible information. If you have questions about this medicine, talk to your doctor, pharmacist, or health care provider.  2023 Elsevier/Gold Standard (2022-02-13 00:00:00)   Alendronate Tablets What is this medication? ALENDRONATE (a LEN droe nate) prevents and treats osteoporosis. It may also be used to treat Paget disease of the bone. It works by Paramedic stronger and less likely to break (fracture). It belongs to a group of medications called bisphosphonates. This medicine may be used for other purposes; ask your health care provider or pharmacist if you have questions. COMMON BRAND NAME(S): Fosamax What should I tell my care team before I take this medication? They need to know if you have any of  these conditions: Bleeding disorder Cancer Dental disease Difficulty swallowing Infection (fever, chills, cough, sore throat, pain or trouble passing urine) Kidney disease Low levels of calcium or other minerals in the blood Low red blood cell counts Receiving steroids like dexamethasone or prednisone Stomach or intestine problems Trouble sitting or standing for 30 minutes An unusual or allergic reaction to alendronate, other medications, foods, dyes or preservatives Pregnant or trying to get pregnant Breast-feeding How should I use this medication? Take this medication by mouth with a full glass of water. Take it as directed on the prescription label at the same time every day. Take the dose right after waking up. Do not eat or drink anything before taking it. Do not take it with any other drink except water. Do not chew or crush the tablet. After taking it, do not eat breakfast, drink, or take any other medications or vitamins for at least 30 minutes. Sit or stand up for at least 30 minutes after you take it. Do not lie down. Keep taking it unless your care team tells you to stop. A special MedGuide will be given to you by the pharmacist with each prescription and refill. Be sure to read this information carefully each time. Talk to your care team about the use of this medication in children. Special care may be needed. Overdosage: If you think you have taken too much of this medicine contact a poison control center or emergency room at once. NOTE: This medicine is only for you. Do not share this medicine with others. What if I miss a dose? If you take your medication once a day, skip it. Take your next dose at the scheduled time the next morning. Do not take two doses on the same day. If you take your medication once a week, take the missed dose on the morning after you remember. Do not take two doses on the same day. What may interact with this medication? Aluminum  hydroxide Antacids Aspirin Calcium supplements Medications for inflammation like ibuprofen, naproxen, and others Iron supplements Magnesium supplements Vitamins with minerals This list may not describe all possible interactions. Give your health care provider a list of all the medicines, herbs, non-prescription drugs, or dietary supplements you use. Also tell them if you smoke, drink alcohol, or use illegal drugs. Some items may interact with your medicine. What should I watch for while using this medication? Visit your care team for regular checks on your progress. It  may be some time before you see the benefit from this medication. Some people who take this medication have severe bone, joint, or muscle pain. This medication may also increase your risk for jaw problems or a broken thigh bone. Tell your care team right away if you have severe pain in your jaw, bones, joints, or muscles. Tell you care team if you have any pain that does not go away or that gets worse. Tell your dentist and dental surgeon that you are taking this medication. You should not have major dental surgery while on this medication. See your dentist to have a dental exam and fix any dental problems before starting this medication. Take good care of your teeth while on this medication. Make sure you see your dentist for regular follow-up appointments. You should make sure you get enough calcium and vitamin D while you are taking this medication. Discuss the foods you eat and the vitamins you take with your care team. You may need blood work done while you are taking this medication. What side effects may I notice from receiving this medication? Side effects that you should report to your care team as soon as possible: Allergic reactions--skin rash, itching, hives, swelling of the face, lips, tongue, or throat Low calcium level--muscle pain or cramps, confusion, tingling, or numbness in the hands or feet Osteonecrosis of the  jaw--pain, swelling, or redness in the mouth, numbness of the jaw, poor healing after dental work, unusual discharge from the mouth, visible bones in the mouth Pain or trouble swallowing Severe bone, joint, or muscle pain Stomach bleeding--bloody or black, tar-like stools, vomiting blood or brown material that looks like coffee grounds Side effects that usually do not require medical attention (report to your care team if they continue or are bothersome): Constipation Diarrhea Nausea Stomach pain This list may not describe all possible side effects. Call your doctor for medical advice about side effects. You may report side effects to FDA at 1-800-FDA-1088. Where should I keep my medication? Keep out of the reach of children and pets. Store at room temperature between 15 and 30 degrees C (59 and 86 degrees F). Throw away any unused medication after the expiration date. NOTE: This sheet is a summary. It may not cover all possible information. If you have questions about this medicine, talk to your doctor, pharmacist, or health care provider.  2023 Elsevier/Gold Standard (2008-01-08 00:00:00)   Osteoporosis  Osteoporosis happens when the bones become thin and less dense than normal. Osteoporosis makes bones more brittle and fragile and more likely to break (fracture). Over time, osteoporosis can cause your bones to become so weak that they fracture after a minor fall. Bones in the hip, wrist, and spine are most likely to fracture due to osteoporosis. What are the causes? The exact cause of this condition is not known. What increases the risk? You are more likely to develop this condition if you: Have family members with this condition. Have poor nutrition. Use the following: Steroid medicines, such as prednisone. Anti-seizure medicines. Nicotine or tobacco, such as cigarettes, e-cigarettes, and chewing tobacco. Are female. Are age 27 or older. Are not physically active (are  sedentary). Are of European or Asian descent. Have a small body frame. What are the signs or symptoms? A fracture might be the first sign of osteoporosis, especially if the fracture results from a fall or injury that usually would not cause a bone to break. Other signs and symptoms include: Pain in the neck or  low back. Stooped posture. Loss of height. How is this diagnosed? This condition may be diagnosed based on: Your medical history. A physical exam. A bone mineral density test, also called a DXA or DEXA test (dual-energy X-ray absorptiometry test). This test uses X-rays to measure the amount of minerals in your bones. How is this treated? This condition may be treated by: Making lifestyle changes, such as: Including foods with more calcium and vitamin D in your diet. Doing weight-bearing and muscle-strengthening exercises. Stopping tobacco use. Limiting alcohol intake. Taking medicine to slow the process of bone loss or to increase bone density. Taking daily supplements of calcium and vitamin D. Taking hormone replacement medicines, such as estrogen for women and testosterone for men. Monitoring your levels of calcium and vitamin D. The goal of treatment is to strengthen your bones and lower your risk for a fracture. Follow these instructions at home: Eating and drinking Include calcium and vitamin D in your diet. Calcium is important for bone health, and vitamin D helps your body absorb calcium. Good sources of calcium and vitamin D include: Certain fatty fish, such as salmon and tuna. Products that have calcium and vitamin D added to them (are fortified), such as fortified cereals. Egg yolks. Cheese. Liver.  Activity Do exercises as told by your health care provider. Ask your health care provider what exercises and activities are safe for you. You should do: Exercises that make you work against gravity (weight-bearing exercises), such as tai chi, yoga, or  walking. Exercises to strengthen muscles, such as lifting weights. Lifestyle Do not drink alcohol if: Your health care provider tells you not to drink. You are pregnant, may be pregnant, or are planning to become pregnant. If you drink alcohol: Limit how much you use to: 0-1 drink a day for women. 0-2 drinks a day for men. Know how much alcohol is in your drink. In the [place] one drink equals one 12 oz bottle of beer (355 mL), one 5 oz glass of wine (148 mL), or one 1 oz glass of hard liquor (44 mL). Do not use any products that contain nicotine or tobacco, such as cigarettes, e-cigarettes, and chewing tobacco. If you need help quitting, ask your health care provider. Preventing falls Use devices to help you move around (mobility aids) as needed, such as canes, walkers, scooters, or crutches. Keep rooms well-lit and clutter-free. Remove tripping hazards from walkways, including cords and throw rugs. Install grab bars in bathrooms and safety rails on stairs. Use rubber mats in the bathroom and other areas that are often wet or slippery. Wear closed-toe shoes that fit well and support your feet. Wear shoes that have rubber soles or low heels. Review your medicines with your health care provider. Some medicines can cause dizziness or changes in blood pressure, which can increase your risk of falling. General instructions Take over-the-counter and prescription medicines only as told by your health care provider. Keep all follow-up visits. This is important. Contact a health care provider if: You have never been screened for osteoporosis and you are: A woman who is age 28 or older. A man who is age 4 or older. Get help right away if: You fall or injure yourself. Summary Osteoporosis is thinning and loss of density in your bones. This makes bones more brittle and fragile and more likely to break (fracture),even with minor falls. The goal of treatment is to strengthen your bones and lower  your risk for a fracture. Include calcium and vitamin D  in your diet. Calcium is important for bone health, and vitamin D helps your body absorb calcium. Talk with your health care provider about screening for osteoporosis if you are a woman who is age 41 or older, or a man who is age 92 or older. This information is not intended to replace advice given to you by your health care provider. Make sure you discuss any questions you have with your health care provider. Document Revised: 05/03/2020 Document Reviewed: 05/03/2020 Elsevier Patient Education  [place].

## 2022-09-03 ENCOUNTER — Telehealth: Payer: Self-pay | Admitting: *Deleted

## 2022-09-03 DIAGNOSIS — M810 Age-related osteoporosis without current pathological fracture: Secondary | ICD-10-CM

## 2022-09-03 LAB — TIQ- MISLABELED
DATE RECEIVED:: 1032023
Test Ordered On Req: 8837

## 2022-09-03 NOTE — Telephone Encounter (Signed)
Dr.Silva the lab came to me and said the PTH with calcium was not drawn yesterday at the visit. Patient will need to come back to have drawn. I wanted to relay to you before I spoke with the patient and I will enter new order.

## 2022-09-05 NOTE — Telephone Encounter (Signed)
Thank you for letting me know.  Encounter reviewed and closed.

## 2022-09-06 ENCOUNTER — Other Ambulatory Visit: Payer: Self-pay | Admitting: Internal Medicine

## 2022-09-06 DIAGNOSIS — T502X5A Adverse effect of carbonic-anhydrase inhibitors, benzothiadiazides and other diuretics, initial encounter: Secondary | ICD-10-CM

## 2022-09-06 DIAGNOSIS — I10 Essential (primary) hypertension: Secondary | ICD-10-CM

## 2022-09-06 LAB — TEST AUTHORIZATION

## 2022-09-06 LAB — CALCIUM: Calcium: 10 mg/dL (ref 8.6–10.4)

## 2022-09-06 LAB — VITAMIN D 25 HYDROXY (VIT D DEFICIENCY, FRACTURES): Vit D, 25-Hydroxy: 23 ng/mL — ABNORMAL LOW (ref 30–100)

## 2022-09-06 LAB — PHOSPHORUS: Phosphorus: 3.8 mg/dL (ref 2.1–4.3)

## 2022-09-08 ENCOUNTER — Other Ambulatory Visit: Payer: Self-pay | Admitting: Obstetrics and Gynecology

## 2022-09-08 ENCOUNTER — Other Ambulatory Visit: Payer: Medicare Other

## 2022-09-08 DIAGNOSIS — M810 Age-related osteoporosis without current pathological fracture: Secondary | ICD-10-CM

## 2022-09-08 NOTE — Telephone Encounter (Signed)
Patient scheduled for lab today at 10am.  Order placed.

## 2022-09-08 NOTE — Addendum Note (Signed)
Addended by: Thamas Jaegers on: 09/08/2022 09:07 AM   Modules accepted: Orders

## 2022-09-09 ENCOUNTER — Telehealth: Payer: Self-pay

## 2022-09-09 ENCOUNTER — Other Ambulatory Visit: Payer: Self-pay

## 2022-09-09 ENCOUNTER — Encounter: Payer: Self-pay | Admitting: Obstetrics and Gynecology

## 2022-09-09 LAB — PARATHYROID HORMONE, INTACT (NO CA): PTH: 42 pg/mL (ref 16–77)

## 2022-09-09 MED ORDER — VITAMIN D (ERGOCALCIFEROL) 1.25 MG (50000 UNIT) PO CAPS: 50000.0000 [IU] | ORAL_CAPSULE | ORAL | 0 refills | Status: DC

## 2022-09-09 NOTE — Telephone Encounter (Signed)
I made a note in her chart to start Fosamax after her vit D is normal.  You may close this encounter after she is informed.

## 2022-09-09 NOTE — Addendum Note (Signed)
Addended by: Yisroel Ramming, Dietrich Pates E on: 09/09/2022 01:47 PM   Modules accepted: Orders

## 2022-09-09 NOTE — Telephone Encounter (Signed)
Patient informed. 

## 2022-09-09 NOTE — Telephone Encounter (Signed)
Patient was informed of all result note info. Rx sent. Follow up lab appt scheduled.  Patient said she was to let Dr. Quincy Simmonds know regarding bone treatment after looking over the information on all 3.  She thinks she will try Fosamax first.

## 2022-09-09 NOTE — Telephone Encounter (Signed)
-----   Message from Nunzio Cobbs, MD sent at 09/09/2022  1:47 PM EDT ----- Please contact patient with results of her testing.  Her vitamin D level is low, and I recommend she take vit D 50,000 IU orally every week for 2 months and then have a lab visit to recheck a vit D level.  I will place a future order.  Her PTH, calcium, and phosphorus are normal.  The PTH came back in a separate result.

## 2022-09-20 ENCOUNTER — Encounter: Payer: Self-pay | Admitting: Internal Medicine

## 2022-09-22 ENCOUNTER — Other Ambulatory Visit: Payer: Self-pay | Admitting: Internal Medicine

## 2022-09-22 DIAGNOSIS — F5104 Psychophysiologic insomnia: Secondary | ICD-10-CM

## 2022-09-22 MED ORDER — ESZOPICLONE 2 MG PO TABS: 2.0000 mg | ORAL_TABLET | Freq: Every evening | ORAL | 1 refills | Status: DC | PRN

## 2022-09-22 NOTE — Telephone Encounter (Signed)
Patient is calling about message below and is completely out.

## 2022-09-22 NOTE — Telephone Encounter (Signed)
Check [place] registry last filled 06/18/2022.Marland Kitchenlmb

## 2022-10-03 ENCOUNTER — Other Ambulatory Visit: Payer: Self-pay | Admitting: Internal Medicine

## 2022-10-03 DIAGNOSIS — I10 Essential (primary) hypertension: Secondary | ICD-10-CM

## 2022-10-09 ENCOUNTER — Ambulatory Visit (INDEPENDENT_AMBULATORY_CARE_PROVIDER_SITE_OTHER): Payer: Medicare Other | Admitting: Radiology

## 2022-10-09 VITALS — BP 110/70

## 2022-10-09 DIAGNOSIS — R350 Frequency of micturition: Secondary | ICD-10-CM | POA: Diagnosis not present

## 2022-10-09 DIAGNOSIS — N9089 Other specified noninflammatory disorders of vulva and perineum: Secondary | ICD-10-CM

## 2022-10-09 LAB — WET PREP FOR TRICH, YEAST, CLUE

## 2022-10-09 MED ORDER — METRONIDAZOLE 0.75 % VA GEL: 1.0000 | Freq: Every day | VAGINAL | 0 refills | Status: AC

## 2022-10-09 MED ORDER — FLUCONAZOLE 150 MG PO TABS: 150.0000 mg | ORAL_TABLET | Freq: Once | ORAL | 0 refills | Status: AC

## 2022-10-09 NOTE — Progress Notes (Signed)
      Subjective: [place] Ketchem is a 66 y.o. female who complains of  urinary frequency.  Started yesterday. Labial irritation, burning sensation   Review of Systems - negative except for above  Objective:  -Vulva: without lesions or discharge -Vagina: scant discharge present,wet prep obtained. Moderate atrophy- improving -Perineum: no lesions  Urine dipstick shows positive for ketones.  Micro exam: + WBC's per HPF and + bacteria.  Microscopic wet-mount exam shows clue cells, hyphae.   Chaperone offered and declined.  Assessment:/Plan:   1. Urinary frequency  - Urinalysis,Complete w/RFL Culture  2. Vulvar irritation +BV and yeast on wet prep - WET PREP FOR TRICH, YEAST, CLUE - metroNIDAZOLE (METROGEL) 0.75 % vaginal gel; Place 1 Applicatorful vaginally at bedtime for 5 days.  Dispense: 70 g; Refill: 0 - fluconazole (DIFLUCAN) 150 MG tablet; Take 1 tablet (150 mg total) by mouth once for 1 dose.  Dispense: 1 tablet; Refill: 0   Will contact patient with results of urine culture. Avoid intercourse until symptoms are resolved. Safe sex encouraged. Avoid the use of soaps or perfumed products in the peri area. Avoid tub baths and sitting in sweaty or wet clothing for prolonged periods of time.

## 2022-10-11 LAB — URINALYSIS, COMPLETE W/RFL CULTURE
Bilirubin Urine: NEGATIVE
Casts: NONE SEEN /LPF
Crystals: NONE SEEN /HPF
Glucose, UA: NEGATIVE
Hgb urine dipstick: NEGATIVE
Hyaline Cast: NONE SEEN /LPF
Leukocyte Esterase: NEGATIVE
Nitrites, Initial: NEGATIVE
Protein, ur: NEGATIVE
RBC / HPF: NONE SEEN /HPF (ref 0–2)
Specific Gravity, Urine: 1.025 (ref 1.001–1.035)
WBC, UA: NONE SEEN /HPF (ref 0–5)
pH: 5 (ref 5.0–8.0)

## 2022-10-11 LAB — URINE CULTURE
MICRO NUMBER:: 14167371
SPECIMEN QUALITY:: ADEQUATE

## 2022-10-11 LAB — CULTURE INDICATED

## 2022-10-15 ENCOUNTER — Ambulatory Visit: Payer: Medicare Other | Admitting: Podiatrist

## 2022-10-15 ENCOUNTER — Ambulatory Visit (INDEPENDENT_AMBULATORY_CARE_PROVIDER_SITE_OTHER): Payer: Medicare Other

## 2022-10-15 DIAGNOSIS — L603 Nail dystrophy: Secondary | ICD-10-CM | POA: Diagnosis not present

## 2022-10-15 DIAGNOSIS — S92525A Nondisplaced fracture of medial phalanx of left lesser toe(s), initial encounter for closed fracture: Secondary | ICD-10-CM

## 2022-10-15 DIAGNOSIS — B351 Tinea unguium: Secondary | ICD-10-CM

## 2022-10-15 DIAGNOSIS — M21619 Bunion of unspecified foot: Secondary | ICD-10-CM

## 2022-10-15 NOTE — Progress Notes (Signed)
Chief Complaint  Patient presents with   Bunions    Bilateral bunions, left foot 3rd and 4th toe possible fracture, started 4 months ago when the patient hit the foot, patient only has pain when walking for a long period of time,      HPI: Patient is 66 y.o. female who presents today for bunions and possible fracture of left fourth toe.  She also has discoloration and thickness of the toenail which is also bothersome.   Patient Active Problem List   Diagnosis Date Noted   Age-related osteoporosis without current pathological fracture 09/02/2022   Ganglion cyst of dorsum of right wrist 06/17/2022   Dyspareunia, female 12/18/2021   Dyslipidemia, goal LDL below 130 12/18/2021   Encounter for general adult medical examination with abnormal findings 12/03/2020   Psychophysiological insomnia 12/03/2020   Diuretic-induced hypokalemia 07/18/2019   Essential hypertension 07/07/2019   Asthma with acute exacerbation 03/27/2014   ALLERGIC RHINITIS 12/29/2008   Allergic-infective asthma 12/29/2008    Current Outpatient Medications on File Prior to Visit  Medication Sig Dispense Refill   Cephalexin 250 MG tablet  (Patient not taking: Reported on 10/09/2022)     clobetasol ointment (TEMOVATE) 0.05 % SMARTSIG:Topical 2-3 Times Weekly     estradiol (ESTRACE) 0.1 MG/GM vaginal cream Place 1 g vaginally 3 (three) times a week. 42.5 g 4   eszopiclone (LUNESTA) 2 MG TABS tablet Take 1 tablet (2 mg total) by mouth at bedtime as needed for sleep. Take immediately before bedtime 90 tablet 1   indapamide (LOZOL) 1.25 MG tablet TAKE 1 TABLET BY MOUTH EVERY DAY 90 tablet 1   irbesartan (AVAPRO) 150 MG tablet TAKE 1 TABLET BY MOUTH EVERY DAY 90 tablet 1   Multiple Minerals-Vitamins (CITRACAL MAXIMUM PLUS) TABS      nitrofurantoin, macrocrystal-monohydrate, (MACROBID) 100 MG capsule Take 1 capsule (100 mg total) by mouth 2 (two) times daily. (Patient not taking: Reported on 10/09/2022) 14 capsule 0   NON  FORMULARY Green Miracle  1 scoop a day     NON FORMULARY 5 billion CFU 6 strains (vegan based probiotic)     potassium chloride (KLOR-CON) 8 MEQ tablet TAKE 2 TABLETS (16 MEQ TOTAL) BY MOUTH 2 (TWO) TIMES DAILY. 360 tablet 0   Vitamin D, Ergocalciferol, (DRISDOL) 1.25 MG (50000 UNIT) CAPS capsule Take 1 capsule (50,000 Units total) by mouth every 7 (seven) days. 8 capsule 0   zinc gluconate 50 MG tablet Take 50 mg by mouth as needed.     No current facility-administered medications on file prior to visit.    No Known Allergies  Review of Systems No fevers, chills, nausea, muscle aches, no difficulty breathing, no calf pain, no chest pain or shortness of breath.   Physical Exam  GENERAL APPEARANCE: Alert, conversant. Appropriately groomed. No acute distress.   VASCULAR: Pedal pulses palpable 2/4 DP and  PT bilateral.  Capillary refill time is immediate to all digits,  Proximal to distal cooling is warm to warm.  Digital perfusion adequate.   NEUROLOGIC: sensation is intact to 5.07 monofilament at 5/5 sites bilateral.  Light touch is intact bilateral, vibratory sensation intact bilateral  MUSCULOSKELETAL: acceptable muscle strength, tone and stability bilateral.  Mild bunion deformities noted bilateral.  No pain, crepitus or limitation noted with foot and ankle range of motion bilateral.   DERMATOLOGIC: skin is warm, supple, and dry.  Color, texture, and turgor of skin within normal limits. Discoloration and thickness of toenail noted right first.  Thickness and  pain on the nail is reported.  Appears fungal.   Xrays:  transverse fracture middle phalanx left third toe is noted. Fracture fragments stable- in good alignment and position.  Mild bunion present bilateral.  No fracture of the fourth toe left noted.   Assessment     ICD-10-CM   1. Nail fungus  B35.1 Culture, fungus without smear    2. Bunion  M21.619 DG Foot Complete Right    DG Foot Complete Left    3. Closed nondisplaced  fracture of middle phalanx of lesser toe of left foot, initial encounter  S92.525A        Plan  Exam findings and treatment recommendations are discussed.  Discussed xray findings and the third toe is fractured and not completely healed yet. Discussed use of a toe splint or taping if the toe is painful. Otherwise it should go on to heal on its own.  Briefly discussed bunions and their progress. Discussed should the bunion start to press on the second toes or cause pain in the future, surgery would be considered- otherwise she should wear wide shoes and toe spacers/ spreaders if she likes. Also discussed the toenail.  It is thick and is uncomfortable.  Discussed taking a sample of the nail to see if it is fungal or from microtrauma.  Will notify her of the fungal culture when it becomes available-  she is interested in topical or laser therapies if it is fungal.

## 2022-10-15 NOTE — Patient Instructions (Signed)
I will call with the result of the culture of the toenails and we can discuss treatment recommendations further if we need to change or do laser.

## 2022-10-17 DIAGNOSIS — N3020 Other chronic cystitis without hematuria: Secondary | ICD-10-CM | POA: Diagnosis not present

## 2022-10-27 ENCOUNTER — Other Ambulatory Visit: Payer: Self-pay | Admitting: Podiatrist

## 2022-10-27 DIAGNOSIS — M21619 Bunion of unspecified foot: Secondary | ICD-10-CM

## 2022-10-27 DIAGNOSIS — B351 Tinea unguium: Secondary | ICD-10-CM

## 2022-10-27 DIAGNOSIS — S92525A Nondisplaced fracture of medial phalanx of left lesser toe(s), initial encounter for closed fracture: Secondary | ICD-10-CM

## 2022-10-28 ENCOUNTER — Encounter: Payer: Self-pay | Admitting: Obstetrics & Gynecology

## 2022-10-28 ENCOUNTER — Ambulatory Visit: Payer: Medicare Other | Admitting: Obstetrics & Gynecology

## 2022-10-28 VITALS — BP 120/82 | HR 76 | Resp 12

## 2022-10-28 DIAGNOSIS — R8290 Unspecified abnormal findings in urine: Secondary | ICD-10-CM | POA: Diagnosis not present

## 2022-10-28 DIAGNOSIS — N9089 Other specified noninflammatory disorders of vulva and perineum: Secondary | ICD-10-CM

## 2022-10-28 LAB — WET PREP FOR TRICH, YEAST, CLUE

## 2022-10-28 MED ORDER — FLUCONAZOLE 150 MG PO TABS: 150.0000 mg | ORAL_TABLET | ORAL | 1 refills | Status: DC

## 2022-10-28 MED ORDER — FLUCONAZOLE 150 MG PO TABS: 150.0000 mg | ORAL_TABLET | Freq: Once | ORAL | 0 refills | Status: DC

## 2022-10-28 NOTE — Progress Notes (Signed)
    Yvette Walton 1956/09/25 [id number]        66 y.o.  G0  RP: Cloudy urine and irritation at vulva  HPI: Used Metrogel and Fluconazole prescribed on 10/09/22.  C/O recurrence of vulvar discomfort.  No odor.  Urine is cloudy, no frequency or dysuria.  No fever.   OB History  Gravida Para Term Preterm AB Living  0 0 0 0 0 0  SAB IAB Ectopic Multiple Live Births  0 0 0 0 0    Past medical history,surgical history, problem list, medications, allergies, family history and social history were all reviewed and documented in the EPIC chart.   Directed ROS with pertinent positives and negatives documented in the history of present illness/assessment and plan.  Exam:  Vitals:   10/28/22 1333  BP: 120/82  Pulse: 76  Resp: 12   General appearance:  Normal  Gynecologic exam: Vulva normal.  Mild vaginal discharge.  Wet prep done.  Wet prep: Yeasts U/A: Slightly cloudy, yellow, Nit Neg, Pro Neg, WBC NS, RBC 0-2, Bacteria NS.  Few Yeasts.   Assessment/Plan:  66 y.o. G0P0000   1. Cloudy urine Used Metrogel and Fluconazole prescribed on 10/09/22.  C/O recurrence of vulvar discomfort.  No odor.  Urine is cloudy, no frequency or dysuria.  No fever. U/A: Slightly cloudy, yellow, Nit Neg, Pro Neg, WBC NS, RBC 0-2, Bacteria NS.  Few Yeasts.  Will treat for Yeasts. - Urinalysis,Complete w/RFL Culture  2. Vulvar irritation Yeast vaginitis confirmed by Wet prep.  Treat with Fluconazole 150 mg 1 tab PO every other day x 3.  Prescription sent to pharmacy. - WET PREP FOR TRICH, YEAST, CLUE  Other orders - fluconazole (DIFLUCAN) 150 MG tablet; Take 1 tablet (150 mg total) by mouth every other day for 3 doses.   Yvette Del MD, 1:40 PM 10/28/2022

## 2022-10-29 ENCOUNTER — Encounter: Payer: Self-pay | Admitting: Obstetrics & Gynecology

## 2022-10-29 DIAGNOSIS — B3731 Acute candidiasis of vulva and vagina: Secondary | ICD-10-CM

## 2022-10-30 LAB — URINALYSIS, COMPLETE W/RFL CULTURE
Bacteria, UA: NONE SEEN /HPF
Bilirubin Urine: NEGATIVE
Glucose, UA: NEGATIVE
Hyaline Cast: NONE SEEN /LPF
Ketones, ur: NEGATIVE
Leukocyte Esterase: NEGATIVE
Nitrites, Initial: NEGATIVE
Protein, ur: NEGATIVE
Specific Gravity, Urine: 1.025 (ref 1.001–1.035)
WBC, UA: NONE SEEN /HPF (ref 0–5)
pH: 5 (ref 5.0–8.0)

## 2022-10-30 LAB — URINE CULTURE
MICRO NUMBER:: 14239575
SPECIMEN QUALITY:: ADEQUATE

## 2022-10-30 LAB — CULTURE INDICATED

## 2022-10-30 MED ORDER — FLUCONAZOLE 200 MG PO TABS: ORAL_TABLET | ORAL | 0 refills | Status: DC

## 2022-10-30 NOTE — Telephone Encounter (Signed)
Per ML: " Agree with Fluconazole 200 mg."   Rx sent and pt notified.

## 2022-10-31 ENCOUNTER — Ambulatory Visit: Payer: Medicare Other | Admitting: Radiology

## 2022-11-04 ENCOUNTER — Encounter: Payer: Self-pay | Admitting: Nurse Practitioner

## 2022-11-05 ENCOUNTER — Other Ambulatory Visit: Payer: Medicare Other

## 2022-11-05 ENCOUNTER — Ambulatory Visit (INDEPENDENT_AMBULATORY_CARE_PROVIDER_SITE_OTHER): Payer: Medicare Other | Admitting: Nurse Practitioner

## 2022-11-05 ENCOUNTER — Encounter: Payer: Self-pay | Admitting: Nurse Practitioner

## 2022-11-05 VITALS — BP 112/72 | HR 54

## 2022-11-05 DIAGNOSIS — N9489 Other specified conditions associated with female genital organs and menstrual cycle: Secondary | ICD-10-CM | POA: Diagnosis not present

## 2022-11-05 DIAGNOSIS — B3731 Acute candidiasis of vulva and vagina: Secondary | ICD-10-CM | POA: Diagnosis not present

## 2022-11-05 DIAGNOSIS — R350 Frequency of micturition: Secondary | ICD-10-CM

## 2022-11-05 LAB — URINALYSIS, COMPLETE W/RFL CULTURE
Bacteria, UA: NONE SEEN /HPF
Bilirubin Urine: NEGATIVE
Glucose, UA: NEGATIVE
Hyaline Cast: NONE SEEN /LPF
Ketones, ur: NEGATIVE
Leukocyte Esterase: NEGATIVE
Nitrites, Initial: NEGATIVE
Protein, ur: NEGATIVE
RBC / HPF: NONE SEEN /HPF (ref 0–2)
Specific Gravity, Urine: 1.015 (ref 1.001–1.035)
WBC, UA: NONE SEEN /HPF (ref 0–5)
pH: 5.5 (ref 5.0–8.0)

## 2022-11-05 LAB — WET PREP FOR TRICH, YEAST, CLUE

## 2022-11-05 LAB — NO CULTURE INDICATED

## 2022-11-05 MED ORDER — TERCONAZOLE 0.4 % VA CREA: 1.0000 | TOPICAL_CREAM | Freq: Every day | VAGINAL | 0 refills | Status: AC

## 2022-11-05 NOTE — Progress Notes (Signed)
   Acute Office Visit  Subjective:    Patient ID: Yvette Walton, female    DOB: [birth year], 66 y.o.   MRN: [id number]   HPI 66 y.o. presents today for burning, irritation, and swelling of vulva and perineum. Treated for BV and yeast 10/09/2022, then treated for yeast again 10/29/22. + wet preps for both. Took last tablet of Diflucan 2 days ago, 3 doses total, 200 mg due to backorder of 150 mg. Denies discharge or odor. Frequent urination and urgency. H/O recurrent UTIs, sees urology. Uses estradiol vaginal cream 3 times weekly internally (has not been using d/t vaginitis), coconut oil and Clobetasol. Has been using Clobetasol daily with some relief.    Review of Systems  Constitutional: Negative.   Genitourinary:  Positive for frequency, urgency and vaginal pain (Vulvar burning and irritation). Negative for difficulty urinating, dysuria, flank pain, genital sores, hematuria, vaginal bleeding and vaginal discharge.       Objective:    Physical Exam Constitutional:      Appearance: Normal appearance.  Genitourinary:    Vagina: Normal.     Cervix: Normal.     Comments: Atrophic changes, mild redness at introitus. No redness at groin where patient reports swelling    BP 112/72   Pulse (!) 54   SpO2 99%  Wt Readings from Last 3 Encounters:  06/17/22 141 lb (64 kg)  02/24/22 145 lb (65.8 kg)  01/09/22 146 lb (66.2 kg)        Patient informed chaperone available to be present for breast and/or pelvic exam. Patient has requested no chaperone to be present. Patient has been advised what will be completed during breast and pelvic exam.   Wet prep + yeast UA negative leukocytes, negative nitrites, trace blood, negative protein, yellow/clear. Microscopic: wbc none, rbc none, bacteria none  Assessment & Plan:   Problem List Items Addressed This Visit   None Visit Diagnoses     Vulvovaginal candidiasis    -  Primary   Relevant Medications   terconazole (TERAZOL 7) 0.4 % vaginal  cream   Vulvar burning       Relevant Orders   WET PREP FOR TRICH, YEAST, CLUE   Urinary frequency       Relevant Orders   Urinalysis,Complete w/RFL Culture      Plan: Not responding to Diflucan. Will try Terazol 0.4% vaginally nightly x 7 nights. If no improvement, yeast culture recommended. Stop Clobetasol until treatment completed, wash with only water and pat dry. UA unremarkable.      Olivia Mackie DNP, 12:17 PM 11/05/2022

## 2022-11-06 ENCOUNTER — Other Ambulatory Visit: Payer: Medicare Other

## 2022-11-06 DIAGNOSIS — R7989 Other specified abnormal findings of blood chemistry: Secondary | ICD-10-CM

## 2022-11-06 DIAGNOSIS — E559 Vitamin D deficiency, unspecified: Secondary | ICD-10-CM | POA: Diagnosis not present

## 2022-11-06 DIAGNOSIS — I10 Essential (primary) hypertension: Secondary | ICD-10-CM | POA: Diagnosis not present

## 2022-11-07 LAB — VITAMIN D 25 HYDROXY (VIT D DEFICIENCY, FRACTURES): Vit D, 25-Hydroxy: 50 ng/mL (ref 30–100)

## 2022-11-09 ENCOUNTER — Other Ambulatory Visit: Payer: Self-pay | Admitting: Obstetrics and Gynecology

## 2022-11-09 MED ORDER — ALENDRONATE SODIUM 70 MG PO TABS: 70.0000 mg | ORAL_TABLET | ORAL | 0 refills | Status: DC

## 2022-11-09 NOTE — Progress Notes (Signed)
Rx for Fosamax to pharmacy.

## 2022-11-18 ENCOUNTER — Encounter: Payer: Medicare Other | Admitting: Internal Medicine

## 2022-11-20 ENCOUNTER — Ambulatory Visit (INDEPENDENT_AMBULATORY_CARE_PROVIDER_SITE_OTHER): Payer: Medicare Other | Admitting: Radiology

## 2022-11-20 VITALS — BP 132/76

## 2022-11-20 DIAGNOSIS — R350 Frequency of micturition: Secondary | ICD-10-CM

## 2022-11-20 DIAGNOSIS — N760 Acute vaginitis: Secondary | ICD-10-CM

## 2022-11-20 LAB — WET PREP FOR TRICH, YEAST, CLUE

## 2022-11-20 LAB — URINALYSIS, COMPLETE W/RFL CULTURE
Bacteria, UA: NONE SEEN /HPF
Bilirubin Urine: NEGATIVE
Casts: NONE SEEN /LPF
Crystals: NONE SEEN /HPF
Glucose, UA: NEGATIVE
Hgb urine dipstick: NEGATIVE
Hyaline Cast: NONE SEEN /LPF
Ketones, ur: NEGATIVE
Leukocyte Esterase: NEGATIVE
Nitrites, Initial: NEGATIVE
Protein, ur: NEGATIVE
RBC / HPF: NONE SEEN /HPF (ref 0–2)
Specific Gravity, Urine: 1.025 (ref 1.001–1.035)
WBC, UA: NONE SEEN /HPF (ref 0–5)
Yeast: NONE SEEN /HPF
pH: 5 (ref 5.0–8.0)

## 2022-11-20 LAB — NO CULTURE INDICATED

## 2022-11-20 NOTE — Progress Notes (Signed)
      Subjective: Yvette Walton is a 66 y.o. female who complains of vaginal irritation, labial swelling, urinary frequency. Symptoms x's 2 days (finished terconazole 11/17/22).   Review of Systems - negative except for above  Objective:  -Vulva: without lesions or discharge -Vagina: no discharge present,wet prep obtained. Moderate atrophy- improving -Perineum: no lesions  Urine dipstick shows negative for all components.  Micro exam: negative for WBC's or RBC's.  Microscopic wet-mount exam shows negative for pathogens, normal epithelial cells.    Chaperone offered and declined.  Assessment:/Plan:   1. Urinary frequency Reassure neg u/a - Urinalysis,Complete w/RFL Culture  2. Vulvar irritation Negative wet prep, reassured Restart estrace cream Coconut oil prn  Avoid intercourse until symptoms are resolved. Safe sex encouraged. Avoid the use of soaps or perfumed products in the peri area. Avoid tub baths and sitting in sweaty or wet clothing for prolonged periods of time.

## 2022-11-28 ENCOUNTER — Other Ambulatory Visit: Payer: Self-pay | Admitting: Internal Medicine

## 2022-11-28 DIAGNOSIS — E876 Hypokalemia: Secondary | ICD-10-CM

## 2022-11-28 DIAGNOSIS — I10 Essential (primary) hypertension: Secondary | ICD-10-CM

## 2022-12-04 ENCOUNTER — Encounter: Payer: Self-pay | Admitting: Podiatrist

## 2022-12-05 ENCOUNTER — Other Ambulatory Visit: Payer: Self-pay | Admitting: Podiatry

## 2022-12-05 MED ORDER — FLUCONAZOLE 150 MG PO TABS: 150.0000 mg | ORAL_TABLET | ORAL | 0 refills | Status: AC

## 2022-12-22 ENCOUNTER — Encounter: Payer: Self-pay | Admitting: Internal Medicine

## 2022-12-22 ENCOUNTER — Ambulatory Visit (INDEPENDENT_AMBULATORY_CARE_PROVIDER_SITE_OTHER): Payer: Medicare Other | Admitting: Internal Medicine

## 2022-12-22 VITALS — BP 134/78 | HR 67 | Temp 98.1°F | Resp 16 | Ht 65.5 in | Wt 137.0 lb

## 2022-12-22 DIAGNOSIS — M8000XD Age-related osteoporosis with current pathological fracture, unspecified site, subsequent encounter for fracture with routine healing: Secondary | ICD-10-CM | POA: Diagnosis not present

## 2022-12-22 DIAGNOSIS — E785 Hyperlipidemia, unspecified: Secondary | ICD-10-CM

## 2022-12-22 DIAGNOSIS — N1831 Chronic kidney disease, stage 3a: Secondary | ICD-10-CM | POA: Insufficient documentation

## 2022-12-22 DIAGNOSIS — F5104 Psychophysiologic insomnia: Secondary | ICD-10-CM

## 2022-12-22 DIAGNOSIS — Z0000 Encounter for general adult medical examination without abnormal findings: Secondary | ICD-10-CM

## 2022-12-22 DIAGNOSIS — T502X5A Adverse effect of carbonic-anhydrase inhibitors, benzothiadiazides and other diuretics, initial encounter: Secondary | ICD-10-CM

## 2022-12-22 DIAGNOSIS — Z0001 Encounter for general adult medical examination with abnormal findings: Secondary | ICD-10-CM

## 2022-12-22 DIAGNOSIS — E876 Hypokalemia: Secondary | ICD-10-CM | POA: Diagnosis not present

## 2022-12-22 DIAGNOSIS — I10 Essential (primary) hypertension: Secondary | ICD-10-CM

## 2022-12-22 DIAGNOSIS — M8000XA Age-related osteoporosis with current pathological fracture, unspecified site, initial encounter for fracture: Secondary | ICD-10-CM | POA: Insufficient documentation

## 2022-12-22 LAB — CBC WITH DIFFERENTIAL/PLATELET
Basophils Absolute: 0.1 10*3/uL (ref 0.0–0.1)
Basophils Relative: 1 % (ref 0.0–3.0)
Eosinophils Absolute: 0.1 10*3/uL (ref 0.0–0.7)
Eosinophils Relative: 1.4 % (ref 0.0–5.0)
HCT: 38.6 % (ref 36.0–46.0)
Hemoglobin: 12.7 g/dL (ref 12.0–15.0)
Lymphocytes Relative: 15.6 % (ref 12.0–46.0)
Lymphs Abs: 1.2 10*3/uL (ref 0.7–4.0)
MCHC: 33 g/dL (ref 30.0–36.0)
MCV: 90.1 fl (ref 78.0–100.0)
Monocytes Absolute: 0.5 10*3/uL (ref 0.1–1.0)
Monocytes Relative: 7.3 % (ref 3.0–12.0)
Neutro Abs: 5.6 10*3/uL (ref 1.4–7.7)
Neutrophils Relative %: 74.7 % (ref 43.0–77.0)
Platelets: 283 10*3/uL (ref 150.0–400.0)
RBC: 4.28 Mil/uL (ref 3.87–5.11)
RDW: 13.4 % (ref 11.5–15.5)
WBC: 7.4 10*3/uL (ref 4.0–10.5)

## 2022-12-22 LAB — HEPATIC FUNCTION PANEL
ALT: 14 U/L (ref 0–35)
AST: 15 U/L (ref 0–37)
Albumin: 4.4 g/dL (ref 3.5–5.2)
Alkaline Phosphatase: 74 U/L (ref 39–117)
Bilirubin, Direct: 0.1 mg/dL (ref 0.0–0.3)
Total Bilirubin: 0.3 mg/dL (ref 0.2–1.2)
Total Protein: 7.8 g/dL (ref 6.0–8.3)

## 2022-12-22 LAB — BASIC METABOLIC PANEL
BUN: 21 mg/dL (ref 6–23)
CO2: 28 mEq/L (ref 19–32)
Calcium: 9.3 mg/dL (ref 8.4–10.5)
Chloride: 99 mEq/L (ref 96–112)
Creatinine, Ser: 0.85 mg/dL (ref 0.40–1.20)
GFR: 71.22 mL/min (ref >60.00)
Glucose, Bld: 105 mg/dL — ABNORMAL HIGH (ref 70–99)
Potassium: 3.9 mEq/L (ref 3.5–5.1)
Sodium: 136 mEq/L (ref 135–145)

## 2022-12-22 LAB — VITAMIN D 25 HYDROXY (VIT D DEFICIENCY, FRACTURES): VITD: 28.39 ng/mL — ABNORMAL LOW (ref 30.00–100.00)

## 2022-12-22 LAB — TSH: TSH: 1.36 u[IU]/mL (ref 0.35–5.50)

## 2022-12-22 MED ORDER — ESZOPICLONE 2 MG PO TABS: 4.0000 mg | ORAL_TABLET | Freq: Every evening | ORAL | 1 refills | Status: DC | PRN

## 2022-12-22 NOTE — Progress Notes (Signed)
Subjective:  Patient ID: Yvette Walton, female    DOB: 1956-11-22  Age: 67 y.o. MRN: [id number]  CC: Annual Exam   HPI Yvette Douthit presents for a CPX and f/up -  She broke a toe doing yoga about a year ago. She is taking 4 mg of lunesta for insomnia (DFA and FA).  She is active and denies chest pain, shortness of breath, diaphoresis, or edema.  Outpatient Medications Prior to Visit  Medication Sig Dispense Refill   alendronate (FOSAMAX) 70 MG tablet Take 1 tablet (70 mg total) by mouth every 7 (seven) days. Take with a full glass of water on an empty stomach. 12 tablet 0   Cephalexin 250 MG tablet as needed.     clobetasol ointment (TEMOVATE) 0.05 % SMARTSIG:Topical 2-3 Times Weekly     estradiol (ESTRACE) 0.1 MG/GM vaginal cream Place 1 g vaginally 3 (three) times a week. 42.5 g 4   fluconazole (DIFLUCAN) 150 MG tablet Take 1 tablet (150 mg total) by mouth once a week for 4 doses. 4 tablet 0   indapamide (LOZOL) 1.25 MG tablet TAKE 1 TABLET BY MOUTH EVERY DAY 90 tablet 1   irbesartan (AVAPRO) 150 MG tablet TAKE 1 TABLET BY MOUTH EVERY DAY 90 tablet 1   Multiple Minerals-Vitamins (CITRACAL MAXIMUM PLUS) TABS      NON FORMULARY Green Miracle  1 scoop a day     NON FORMULARY 5 billion CFU 6 strains (vegan based probiotic)     potassium chloride (KLOR-CON) 8 MEQ tablet TAKE 2 TABLETS (16 MEQ TOTAL) BY MOUTH 2 (TWO) TIMES DAILY. 360 tablet 0   zinc gluconate 50 MG tablet Take 50 mg by mouth as needed.     eszopiclone (LUNESTA) 2 MG TABS tablet Take 1 tablet (2 mg total) by mouth at bedtime as needed for sleep. Take immediately before bedtime 90 tablet 1   No facility-administered medications prior to visit.    ROS Review of Systems  Constitutional:  Negative for chills, diaphoresis, fatigue and fever.  HENT: Negative.    Eyes: Negative.   Respiratory:  Negative for cough, chest tightness, shortness of breath and wheezing.   Cardiovascular:  Negative for chest pain, palpitations and  leg swelling.  Gastrointestinal:  Negative for abdominal pain, diarrhea, nausea and vomiting.  Endocrine: Negative.   Genitourinary: Negative.  Negative for difficulty urinating.  Musculoskeletal: Negative.  Negative for arthralgias and myalgias.  Skin: Negative.  Negative for color change and pallor.  Neurological: Negative.  Negative for dizziness and weakness.  Hematological:  Negative for adenopathy. Does not bruise/bleed easily.  Psychiatric/Behavioral:  Positive for sleep disturbance. Negative for confusion, decreased concentration, dysphoric mood and suicidal ideas. The patient is nervous/anxious.     Objective:  BP 134/78 (BP Location: Left Arm, Patient Position: Sitting, Cuff Size: Large)   Pulse 67   Temp 98.1 F (36.7 C) (Oral)   Resp 16   Ht 5' 5.5" (1.664 m)   Wt 137 lb (62.1 kg)   SpO2 97%   BMI 22.45 kg/m   BP Readings from Last 3 Encounters:  12/22/22 134/78  11/20/22 132/76  11/05/22 112/72    Wt Readings from Last 3 Encounters:  12/22/22 137 lb (62.1 kg)  06/17/22 141 lb (64 kg)  02/24/22 145 lb (65.8 kg)    Physical Exam Vitals reviewed.  HENT:     Nose: Nose normal.     Mouth/Throat:     Mouth: Mucous membranes are moist.  Eyes:  General: No scleral icterus.    Conjunctiva/sclera: Conjunctivae normal.  Cardiovascular:     Rate and Rhythm: Normal rate and regular rhythm.     Heart sounds: No murmur heard. Pulmonary:     Effort: Pulmonary effort is normal.     Breath sounds: No stridor. No wheezing, rhonchi or rales.  Abdominal:     General: Abdomen is flat.     Palpations: There is no mass.     Tenderness: There is no abdominal tenderness. There is no guarding.     Hernia: No hernia is present.  Musculoskeletal:        General: Normal range of motion.     Cervical back: Neck supple.     Right lower leg: No edema.     Left lower leg: No edema.  Lymphadenopathy:     Cervical: No cervical adenopathy.  Skin:    General: Skin is warm and  dry.     Coloration: Skin is not pale.  Neurological:     General: No focal deficit present.     Mental Status: She is alert. Mental status is at baseline.  Psychiatric:        Attention and Perception: Attention normal.        Mood and Affect: Affect normal. Mood is anxious.        Speech: Speech normal.        Behavior: Behavior normal.        Thought Content: Thought content normal.        Cognition and Memory: Cognition normal.     Lab Results  Component Value Date   WBC 7.4 12/22/2022   HGB 12.7 12/22/2022   HCT 38.6 12/22/2022   PLT 283.0 12/22/2022   GLUCOSE 105 (H) 12/22/2022   CHOL 179 12/18/2021   TRIG 106.0 12/18/2021   HDL 59.00 12/18/2021   LDLCALC 99 12/18/2021   ALT 14 12/22/2022   AST 15 12/22/2022   NA 136 12/22/2022   K 3.9 12/22/2022   CL 99 12/22/2022   CREATININE 0.85 12/22/2022   BUN 21 12/22/2022   CO2 28 12/22/2022   TSH 1.36 12/22/2022    DG MOBILE BONE DENSITY  Result Date: 08/27/2022 EXAM: DUAL X-RAY ABSORPTIOMETRY (DXA) FOR BONE MINERAL DENSITY 08/07/2022 11:26 am CLINICAL DATA:  67 year old Female Postmenopausal. menopause TECHNIQUE: An axial (e.g., hips, spine) and/or appendicular (e.g., radius) exam was performed, as appropriate, using Haematologist at [facility]. Images are obtained for bone mineral density measurement and are not obtained for diagnostic purposes. [id number] Exclusions: Lumbar L1 and L2 due to greater than 1 T-score standard deviation. COMPARISON:  None. FINDINGS: Scan quality: Good. LUMBAR SPINE (L3-L4): BMD (in g/cm2): 0.797 T-score: -2.8 Z-score: -0.8 LEFT FEMORAL NECK: BMD (in g/cm2): 0.569 T-score: -2.5 Z-score: -0.9 LEFT TOTAL HIP: BMD (in g/cm2): 0.736 T-score: -1.7 Z-score: -0.4 LEFT FOREARM (RADIUS 33%): BMD (in g/cm2): 0.596 T-score: -1.6 Z-score: 0.1 FRAX 10-YEAR PROBABILITY OF FRACTURE: Patient does not meet criteria for FRAX assessment. IMPRESSION: Osteoporosis based on BMD. Fracture risk is  increased. Increased risk is based on low BMD. RECOMMENDATIONS: 1. All patients should optimize calcium and vitamin D intake. 2. Consider FDA-approved medical therapies in postmenopausal women and men aged 22 years and older, based on the following: - A hip or vertebral (clinical or morphometric) fracture - T-score less than or equal to -2.5 and secondary causes have been excluded. - Low bone mass (T-score between -1.0 and -2.5) and a 10-year probability  of a hip fracture greater than or equal to 3% or a 10-year probability of a major osteoporosis-related fracture greater than or equal to 20% based on the [place]-adapted WHO algorithm. - Clinician judgment and/or patient preferences may indicate treatment for people with 10-year fracture probabilities above or below these levels 3. Patients with diagnosis of osteoporosis or at high risk for fracture should have regular bone mineral density tests. For patients eligible for Medicare, routine testing is allowed once every 2 years. The testing frequency can be increased to one year for patients who have rapidly progressing disease, those who are receiving or discontinuing medical therapy to restore bone mass, or have additional risk factors. Electronically Signed   By: Ammie Ferrier M.D.   On: 08/27/2022 11:17    Assessment & Plan:   Yvette was seen today for annual exam.  Diagnoses and all orders for this visit:  Essential hypertension- Her BP is well controlled. -     TSH; Future -     Hepatic function panel; Future -     CBC with Differential/Platelet; Future -     Basic metabolic panel; Future -     Basic metabolic panel -     CBC with Differential/Platelet -     Hepatic function panel -     TSH  Psychophysiological insomnia -     TSH; Future -     eszopiclone (LUNESTA) 2 MG TABS tablet; Take 2 tablets (4 mg total) by mouth at bedtime as needed for sleep. Take immediately before bedtime -     TSH  Dyslipidemia, goal LDL below 130- Statin is  not indicated. -     TSH; Future -     TSH  Encounter for general adult medical examination with abnormal findings -  Exam completed, labs reviewed, vaccines updated, cancer screenings are up-to-date, patient education was given.  Diuretic-induced hypokalemia- K+ is normal.  Age-related osteoporosis with current pathological fracture with routine healing, subsequent encounter- I recommended that she upgrade to an anabolic agent (evenity). -     VITAMIN D 25 Hydroxy (Vit-D Deficiency, Fractures); Future -     Basic metabolic panel; Future -     Basic metabolic panel -     VITAMIN D 25 Hydroxy (Vit-D Deficiency, Fractures)   I have changed Yvette Dolinsky "Gail"'s eszopiclone. I am also having her maintain her NON FORMULARY, zinc gluconate, clobetasol ointment, NON FORMULARY, estradiol, indapamide, Cephalexin, Citracal Maximum Plus, irbesartan, alendronate, potassium chloride, and fluconazole.  Meds ordered this encounter  Medications   eszopiclone (LUNESTA) 2 MG TABS tablet    Sig: Take 2 tablets (4 mg total) by mouth at bedtime as needed for sleep. Take immediately before bedtime    Dispense:  180 tablet    Refill:  1     Follow-up: Return in about 6 months (around 06/22/2023).  Scarlette Calico, MD

## 2022-12-22 NOTE — Patient Instructions (Signed)

## 2022-12-23 ENCOUNTER — Ambulatory Visit: Payer: Medicare Other | Admitting: Obstetrics and Gynecology

## 2022-12-23 ENCOUNTER — Other Ambulatory Visit (HOSPITAL_COMMUNITY)
Admission: RE | Admit: 2022-12-23 | Discharge: 2022-12-23 | Disposition: A | Discharge status: Home / Self Care | Payer: Medicare Other | Source: Ambulatory Visit | Attending: Obstetrics and Gynecology | Admitting: Obstetrics and Gynecology

## 2022-12-23 ENCOUNTER — Encounter: Payer: Self-pay | Admitting: Obstetrics and Gynecology

## 2022-12-23 VITALS — BP 126/80 | HR 56 | Ht 65.25 in | Wt 137.0 lb

## 2022-12-23 DIAGNOSIS — M810 Age-related osteoporosis without current pathological fracture: Secondary | ICD-10-CM

## 2022-12-23 DIAGNOSIS — R7989 Other specified abnormal findings of blood chemistry: Secondary | ICD-10-CM | POA: Diagnosis not present

## 2022-12-23 DIAGNOSIS — N949 Unspecified condition associated with female genital organs and menstrual cycle: Secondary | ICD-10-CM | POA: Diagnosis not present

## 2022-12-23 DIAGNOSIS — R300 Dysuria: Secondary | ICD-10-CM

## 2022-12-23 DIAGNOSIS — R7309 Other abnormal glucose: Secondary | ICD-10-CM

## 2022-12-23 DIAGNOSIS — R739 Hyperglycemia, unspecified: Secondary | ICD-10-CM

## 2022-12-23 DIAGNOSIS — N760 Acute vaginitis: Secondary | ICD-10-CM

## 2022-12-23 HISTORY — DX: Other abnormal glucose: R73.09

## 2022-12-23 NOTE — Progress Notes (Signed)
GYNECOLOGY  VISIT   HPI: 67 y.o.   Single  Caucasian  female   G0P0000 with No LMP recorded. Patient is postmenopausal.   here for   UTI. Vaginal irritation and burning. Irritation on the external vulvar area.  Noticed some increase in discomfort after having a drink of ETOH.   No dysuria.   Using estrogen cream three times a week.   Has rx for Clobetasol.  Using coconut oil external as well.   Has been taking fluconazole for 3 weeks for a toenail infection.   Vit D 28.39 yesterday.  Taking vit D 800 IU daily.  Taking Fosamax weekly for osteoporosis of her hip and spine.   Her PCP is recommending Evenity once a month.   She is not having any problems taking Fosamax.   Going to the beach next week.   GYNECOLOGIC HISTORY: No LMP recorded. Patient is postmenopausal. Contraception:  postmenopausal Menopausal hormone therapy:  estrace Last mammogram:  02/06/22 Breast Density Category B, BI-RADS CATEGORY 1 Neg Last pap smear:   02/24/22 neg, 07/18/19 neg: HR HPV neg        OB History     Gravida  0   Para  0   Term  0   Preterm  0   AB  0   Living  0      SAB  0   IAB  0   Ectopic  0   Multiple  0   Live Births  0              Patient Active Problem List   Diagnosis Date Noted   Age-related osteoporosis with current pathological fracture 12/22/2022   Stage 3a chronic kidney disease ([place]) 12/22/2022   Dyslipidemia, goal LDL below 130 12/18/2021   Encounter for general adult medical examination with abnormal findings 12/03/2020   Psychophysiological insomnia 12/03/2020   Diuretic-induced hypokalemia 07/18/2019   Essential hypertension 07/07/2019   ALLERGIC RHINITIS 12/29/2008   Allergic-infective asthma 12/29/2008    Past Medical History:  Diagnosis Date   Allergic rhinitis    Asthma    Hypertension    Osteoporosis 2023   spine and hip    Past Surgical History:  Procedure Laterality Date   NASAL SEPTUM SURGERY     TONSILLECTOMY       Current Outpatient Medications  Medication Sig Dispense Refill   alendronate (FOSAMAX) 70 MG tablet Take 1 tablet (70 mg total) by mouth every 7 (seven) days. Take with a full glass of water on an empty stomach. 12 tablet 0   Cephalexin 250 MG tablet as needed.     clobetasol ointment (TEMOVATE) 0.05 % SMARTSIG:Topical 2-3 Times Weekly     estradiol (ESTRACE) 0.1 MG/GM vaginal cream Place 1 g vaginally 3 (three) times a week. 42.5 g 4   eszopiclone (LUNESTA) 2 MG TABS tablet Take 2 tablets (4 mg total) by mouth at bedtime as needed for sleep. Take immediately before bedtime 180 tablet 1   fluconazole (DIFLUCAN) 150 MG tablet Take 1 tablet (150 mg total) by mouth once a week for 4 doses. 4 tablet 0   indapamide (LOZOL) 1.25 MG tablet TAKE 1 TABLET BY MOUTH EVERY DAY 90 tablet 1   irbesartan (AVAPRO) 150 MG tablet TAKE 1 TABLET BY MOUTH EVERY DAY 90 tablet 1   Multiple Minerals-Vitamins (CITRACAL MAXIMUM PLUS) TABS      NON FORMULARY Green Miracle  1 scoop a day     NON FORMULARY 5 billion  CFU 6 strains (vegan based probiotic)     potassium chloride (KLOR-CON) 8 MEQ tablet TAKE 2 TABLETS (16 MEQ TOTAL) BY MOUTH 2 (TWO) TIMES DAILY. 360 tablet 0   zinc gluconate 50 MG tablet Take 50 mg by mouth as needed.     No current facility-administered medications for this visit.     ALLERGIES: Patient has no known allergies.  Family History  Problem Relation Age of Onset   Coronary artery disease Mother    Coronary artery disease Father    Prostate cancer Father    Diabetes Father        AOD   Colon cancer Neg Hx    Esophageal cancer Neg Hx    Rectal cancer Neg Hx    Stomach cancer Neg Hx     Social History   Socioeconomic History   Marital status: Single    Spouse name: Not on file   Number of children: Not on file   Years of education: Not on file   Highest education level: Not on file  Occupational History   Occupation: Works full time    Employer: [employer]   Tobacco Use   Smoking status: Never    Passive exposure: Past   Smokeless tobacco: Never   Tobacco comments:    occasional smoker 20+ years ago. Social smoker.  Vaping Use   Vaping Use: Never used  Substance and Sexual Activity   Alcohol use: Yes    Alcohol/week: 2.0 standard drinks of alcohol    Types: 2 Glasses of wine per week   Drug use: No   Sexual activity: Yes    Birth control/protection: Post-menopausal    Comment: First IC >16y/o, <5 Partners, DES-neg  Other Topics Concern   Not on file  Social History Narrative   Not on file   Social Determinants of Health   Financial Resource Strain: Low Risk  (04/11/2022)   Overall Financial Resource Strain (CARDIA)    Difficulty of Paying Living Expenses: Not hard at all  Food Insecurity: No Food Insecurity (04/11/2022)   Hunger Vital Sign    Worried About Running Out of Food in the Last Year: Never true    Ran Out of Food in the Last Year: Never true  Transportation Needs: No Transportation Needs (04/11/2022)   PRAPARE - Administrator, Civil Service (Medical): No    Lack of Transportation (Non-Medical): No  Physical Activity: Sufficiently Active (04/11/2022)   Exercise Vital Sign    Days of Exercise per Week: 5 days    Minutes of Exercise per Session: 30 min  Stress: No Stress Concern Present (04/11/2022)   [employer] of Occupational Health - Occupational Stress Questionnaire    Feeling of Stress : Not at all  Social Connections: Moderately Integrated (04/11/2022)   Social Connection and Isolation Panel [NHANES]    Frequency of Communication with Friends and Family: More than three times a week    Frequency of Social Gatherings with Friends and Family: More than three times a week    Attends Religious Services: 1 to 4 times per year    Active Member of [employer] or Organizations: Yes    Attends Banker Meetings: 1 to 4 times per year    Marital Status: Never married  Intimate Partner Violence: Not  At Risk (04/11/2022)   Humiliation, Afraid, Rape, and Kick questionnaire    Fear of Current or Ex-Partner: No    Emotionally Abused: No  Physically Abused: No    Sexually Abused: No    Review of Systems  Genitourinary:  Positive for dysuria and vaginal pain.    PHYSICAL EXAMINATION:    BP 126/80 (BP Location: Left Arm, Patient Position: Sitting, Cuff Size: Normal)   Pulse (!) 56   Ht 5' 5.25" (1.657 m)   Wt 137 lb (62.1 kg)   SpO2 98%   BMI 22.62 kg/m     General appearance: alert, cooperative and appears stated age    Pelvic: External genitalia:  no lesions              Urethra:  normal appearing urethra with no masses, tenderness or lesions              Bartholins and Skenes: normal                 Vagina: normal appearing vagina with normal color and discharge, no lesions              Cervix: no lesions                Bimanual Exam:  Uterus:  normal size, contour, position, consistency, mobility, non-tender              Adnexa: no mass, fullness, tenderness           Chaperone was present for exam:  Raquel Sarna  ASSESSMENT  Vaginitis.  Vaginal burning.  Low vit D.  Osteoporosis.  Elevated blood sugar.  HTN. FH CAD.   PLAN  Urinalysis:  sg 1.025, ph 5.5, 0 - 5 WBC, NS RBC, 6 - 10 epis, few budding yeast, few mucus.  UC sent.  Vaginitis Nuswab.  Increase vit D to 2000 IU daily.  We did an Up to Date literature review on Evenity.  Risks and benefits reviewed.  I encouraged her to have additional conversation with her PCP before making a change in her osteoporosis medication and stopping Fosamax.     An After Visit Summary was printed and given to the patient.  39 min  total time was spent for this patient encounter, including preparation, face-to-face counseling with the patient, coordination of care, and documentation of the encounter.

## 2022-12-24 ENCOUNTER — Encounter: Payer: Self-pay | Admitting: Obstetrics and Gynecology

## 2022-12-24 ENCOUNTER — Encounter: Payer: Self-pay | Admitting: Internal Medicine

## 2022-12-24 ENCOUNTER — Telehealth: Payer: Self-pay

## 2022-12-24 LAB — URINE CULTURE
MICRO NUMBER:: 14460631
Result:: NO GROWTH
SPECIMEN QUALITY:: ADEQUATE

## 2022-12-24 LAB — URINALYSIS, COMPLETE W/RFL CULTURE
Bacteria, UA: NONE SEEN /HPF
Bilirubin Urine: NEGATIVE
Glucose, UA: NEGATIVE
Hyaline Cast: NONE SEEN /LPF
Leukocyte Esterase: NEGATIVE
Nitrites, Initial: NEGATIVE
Protein, ur: NEGATIVE
RBC / HPF: NONE SEEN /HPF (ref 0–2)
Specific Gravity, Urine: 1.025 (ref 1.001–1.035)
pH: 5.5 (ref 5.0–8.0)

## 2022-12-24 LAB — CERVICOVAGINAL ANCILLARY ONLY
Bacterial Vaginitis (gardnerella): NEGATIVE
Candida Glabrata: NEGATIVE
Candida Vaginitis: NEGATIVE
Comment: NEGATIVE
Comment: NEGATIVE
Comment: NEGATIVE
Comment: NEGATIVE
Trichomonas: NEGATIVE

## 2022-12-24 LAB — HEMOGLOBIN A1C
Hgb A1c MFr Bld: 6.1 % of total Hgb — ABNORMAL HIGH (ref <5.7)
Mean Plasma Glucose: 128 mg/dL
eAG (mmol/L): 7.1 mmol/L

## 2022-12-24 LAB — CULTURE INDICATED

## 2022-12-24 NOTE — Telephone Encounter (Signed)
Janith Lima, MD  Jasper Loser, CMA Will you check to see if she can get evenity?

## 2022-12-25 DIAGNOSIS — H2513 Age-related nuclear cataract, bilateral: Secondary | ICD-10-CM | POA: Diagnosis not present

## 2022-12-25 DIAGNOSIS — H25013 Cortical age-related cataract, bilateral: Secondary | ICD-10-CM | POA: Diagnosis not present

## 2022-12-25 DIAGNOSIS — H18453 Nodular corneal degeneration, bilateral: Secondary | ICD-10-CM | POA: Diagnosis not present

## 2022-12-29 NOTE — Telephone Encounter (Signed)
Evenity VOB initiated via [email or web address]  New start  Current fracture   Med hx: Fosamax 2023

## 2023-01-05 NOTE — Telephone Encounter (Signed)
Prior auth required for PROLIA  PA PROCESS DETAILS: Please complete the prior authorization form located at [email or web address] or call [phone]  

## 2023-01-19 ENCOUNTER — Encounter: Payer: Medicare Other | Admitting: Internal Medicine

## 2023-01-20 NOTE — Telephone Encounter (Signed)
Prior Authorization initiated for [facility] via [facility] Provider portal.  Case ID: [id number]

## 2023-01-28 ENCOUNTER — Encounter: Payer: Self-pay | Admitting: Internal Medicine

## 2023-01-30 ENCOUNTER — Other Ambulatory Visit: Payer: Self-pay | Admitting: Obstetrics and Gynecology

## 2023-01-30 ENCOUNTER — Other Ambulatory Visit (HOSPITAL_COMMUNITY): Payer: Self-pay

## 2023-01-30 ENCOUNTER — Telehealth: Payer: Self-pay | Admitting: *Deleted

## 2023-01-30 NOTE — Telephone Encounter (Signed)
Prolia VOB initiated via [email or web address] 

## 2023-01-30 NOTE — Telephone Encounter (Signed)
Per last conversation regarding osteoporosis treatment pt declined Evenity, Pt would like to continue taken fosamax.  Pt would like to know when she should have her next bone density scan. Routing to Provider for advise

## 2023-01-30 NOTE — Telephone Encounter (Signed)
Pharmacy Patient Advocate Encounter  Insurance verification completed.    The patient is insured through [employer] for: Prolia '60mg'$ .  Pharmacy benefit copay: $300

## 2023-01-30 NOTE — Telephone Encounter (Signed)
OK to continue with Fosamax.   Her next office visit here is due in 3 months.   Her next bone density is due in September, 2025.

## 2023-01-30 NOTE — Telephone Encounter (Signed)
Initiated PA via [facility] portal. Do patient have a history of failure of BOTH oral and IV bisphosphate? Please advise

## 2023-02-02 NOTE — Telephone Encounter (Signed)
Melton Alar, CMA Left message for patient to call and schedule appointment.

## 2023-02-02 NOTE — Telephone Encounter (Signed)
Pt notified and voiced understanding. Msg sent to schedulers for 61yrmed check in ~3 months.

## 2023-02-04 ENCOUNTER — Other Ambulatory Visit: Payer: Self-pay | Admitting: Internal Medicine

## 2023-02-04 DIAGNOSIS — I10 Essential (primary) hypertension: Secondary | ICD-10-CM

## 2023-02-04 NOTE — Telephone Encounter (Signed)
FYI. Pt scheduled for 05/26/2023. Will send to provider for final review and close encounter.

## 2023-02-10 ENCOUNTER — Other Ambulatory Visit: Payer: Self-pay | Admitting: Internal Medicine

## 2023-02-10 ENCOUNTER — Encounter: Payer: Self-pay | Admitting: Internal Medicine

## 2023-02-10 DIAGNOSIS — F5104 Psychophysiologic insomnia: Secondary | ICD-10-CM

## 2023-02-10 MED ORDER — ESZOPICLONE 3 MG PO TABS: 3.0000 mg | ORAL_TABLET | Freq: Every day | ORAL | 0 refills | Status: DC

## 2023-02-12 NOTE — Telephone Encounter (Signed)
Patient called to make sure Dr. Ronnald Ramp received her MyChart message. [facility] will not fill her medication because they said it's too soon, even though it was a new dosage sent in. She would like for Dr. Ronnald Ramp to let them know that it is a different dosage that is okay for her to get. Best callback is [phone].

## 2023-02-17 NOTE — Telephone Encounter (Signed)
Patient would like for Shirron to give her a call back about the status of her medication getting filled. Best callback is [phone].

## 2023-02-17 NOTE — Telephone Encounter (Signed)
I have called the pharmacy and was informed that Rx was only waiting for a request to be filled. I have informed them to prepare Rx for pt. Pharmacist stated that the co-pay is $64.67.  Pt has been informed.

## 2023-02-19 ENCOUNTER — Other Ambulatory Visit: Payer: Self-pay | Admitting: Internal Medicine

## 2023-02-19 DIAGNOSIS — E876 Hypokalemia: Secondary | ICD-10-CM

## 2023-02-19 DIAGNOSIS — I10 Essential (primary) hypertension: Secondary | ICD-10-CM

## 2023-02-20 ENCOUNTER — Ambulatory Visit
Admission: RE | Admit: 2023-02-20 | Discharge: 2023-02-20 | Disposition: A | Discharge status: Home / Self Care | Payer: Medicare Other | Source: Ambulatory Visit | Attending: Internal Medicine | Admitting: Internal Medicine

## 2023-02-20 ENCOUNTER — Other Ambulatory Visit: Payer: Self-pay | Admitting: Internal Medicine

## 2023-02-20 DIAGNOSIS — Z1231 Encounter for screening mammogram for malignant neoplasm of breast: Secondary | ICD-10-CM | POA: Diagnosis not present

## 2023-03-09 ENCOUNTER — Ambulatory Visit: Payer: Medicare Other | Admitting: Obstetrics and Gynecology

## 2023-03-09 ENCOUNTER — Encounter: Payer: Self-pay | Admitting: Obstetrics and Gynecology

## 2023-03-09 VITALS — BP 126/80 | HR 58 | Ht 65.25 in

## 2023-03-09 DIAGNOSIS — B3731 Acute candidiasis of vulva and vagina: Secondary | ICD-10-CM

## 2023-03-09 DIAGNOSIS — N949 Unspecified condition associated with female genital organs and menstrual cycle: Secondary | ICD-10-CM | POA: Diagnosis not present

## 2023-03-09 DIAGNOSIS — R7309 Other abnormal glucose: Secondary | ICD-10-CM

## 2023-03-09 DIAGNOSIS — N390 Urinary tract infection, site not specified: Secondary | ICD-10-CM | POA: Diagnosis not present

## 2023-03-09 LAB — URINALYSIS, COMPLETE W/RFL CULTURE
Bacteria, UA: NONE SEEN /HPF
Nitrites, Initial: NEGATIVE
Protein, ur: NEGATIVE
WBC, UA: NONE SEEN /HPF (ref 0–5)

## 2023-03-09 LAB — WET PREP FOR TRICH, YEAST, CLUE

## 2023-03-09 LAB — CULTURE INDICATED

## 2023-03-09 MED ORDER — TERCONAZOLE 0.8 % VA CREA: 1.0000 | TOPICAL_CREAM | Freq: Every day | VAGINAL | 0 refills | Status: DC

## 2023-03-09 NOTE — Progress Notes (Unsigned)
GYNECOLOGY  VISIT   HPI: 67 y.o.   Single  Caucasian  female   G0P0000 with No LMP recorded. Patient is postmenopausal.   here for  vaginal irritation. Pt noticed irritation on the outside with some bloating. Pt last used estradiol cream on Saturday.  Some bloating.  No dysuria.    Using vaginal estrogen cream internally and externally three times a week.   Stopped using vaginal soap.  Using a probiotic recommended through her urologist.  She uses Cephalexin post coital.  Last intercourse was about 10 days ago.   She has used both Fluconazole and vaginal cream to treat her yeast infection.  She prefers vaginal treatment.   Her A1C is 6.1.  GYNECOLOGIC HISTORY: No LMP recorded. Patient is postmenopausal. Contraception:  PMP Menopausal hormone therapy:  estrace Last mammogram:    02/20/23 Breast Density Category B, BI-RADS CATEGORY 1 Neg  Last pap smear:   02/24/22 neg, 07/18/19 neg: HR HPV neg         OB History     Gravida  0   Para  0   Term  0   Preterm  0   AB  0   Living  0      SAB  0   IAB  0   Ectopic  0   Multiple  0   Live Births  0              Patient Active Problem List   Diagnosis Date Noted   Age-related osteoporosis with current pathological fracture 12/22/2022   Dyslipidemia, goal LDL below 130 12/18/2021   Encounter for general adult medical examination with abnormal findings 12/03/2020   Psychophysiological insomnia 12/03/2020   Diuretic-induced hypokalemia 07/18/2019   Essential hypertension 07/07/2019    Past Medical History:  Diagnosis Date   Allergic rhinitis    Asthma    Elevated hemoglobin A1c 12/23/2022   6.1   Hypertension    Osteoporosis 2023   spine and hip    Past Surgical History:  Procedure Laterality Date   NASAL SEPTUM SURGERY     TONSILLECTOMY      Current Outpatient Medications  Medication Sig Dispense Refill   alendronate (FOSAMAX) 70 MG tablet TAKE 1 TABLET (70 MG TOTAL) BY MOUTH EVERY 7 DAYS  WITH FULL GLASS WATER ON EMPTY STOMACH 12 tablet 0   Cephalexin 250 MG tablet as needed.     clobetasol ointment (TEMOVATE) 0.05 % SMARTSIG:Topical 2-3 Times Weekly     estradiol (ESTRACE) 0.1 MG/GM vaginal cream Place 1 g vaginally 3 (three) times a week. 42.5 g 4   eszopiclone 3 MG TABS Take 1 tablet (3 mg total) by mouth at bedtime. Take immediately before bedtime 90 tablet 0   indapamide (LOZOL) 1.25 MG tablet TAKE 1 TABLET BY MOUTH EVERY DAY 90 tablet 1   irbesartan (AVAPRO) 150 MG tablet TAKE 1 TABLET BY MOUTH EVERY DAY 90 tablet 1   NON FORMULARY Green Miracle  1 scoop a day     NON FORMULARY 5 billion CFU 6 strains (vegan based probiotic)     potassium chloride (KLOR-CON) 8 MEQ tablet TAKE 2 TABLETS (16 MEQ TOTAL) BY MOUTH 2 (TWO) TIMES DAILY. 360 tablet 0   terconazole (TERAZOL 3) 0.8 % vaginal cream Place 1 applicator vaginally at bedtime. Place nightly for 3 nights. 20 g 0   zinc gluconate 50 MG tablet Take 50 mg by mouth as needed.     No current facility-administered medications  for this visit.     ALLERGIES: Patient has no known allergies.  Family History  Problem Relation Age of Onset   Coronary artery disease Mother    Coronary artery disease Father    Prostate cancer Father    Diabetes Father        AOD   Colon cancer Neg Hx    Esophageal cancer Neg Hx    Rectal cancer Neg Hx    Stomach cancer Neg Hx     Social History   Socioeconomic History   Marital status: Single    Spouse name: Not on file   Number of children: Not on file   Years of education: Not on file   Highest education level: Not on file  Occupational History   Occupation: Works full time    Employer: [employer]  Tobacco Use   Smoking status: Never    Passive exposure: Past   Smokeless tobacco: Never   Tobacco comments:    occasional smoker 20+ years ago. Social smoker.  Vaping Use   Vaping Use: Never used  Substance and Sexual Activity   Alcohol use: Yes    Alcohol/week: 2.0  standard drinks of alcohol    Types: 2 Glasses of wine per week   Drug use: No   Sexual activity: Yes    Birth control/protection: Post-menopausal    Comment: First IC >16y/o, <5 Partners, DES-neg  Other Topics Concern   Not on file  Social History Narrative   Not on file   Social Determinants of Health   Financial Resource Strain: Low Risk  (04/11/2022)   Overall Financial Resource Strain (CARDIA)    Difficulty of Paying Living Expenses: Not hard at all  Food Insecurity: No Food Insecurity (04/11/2022)   Hunger Vital Sign    Worried About Running Out of Food in the Last Year: Never true    Ran Out of Food in the Last Year: Never true  Transportation Needs: No Transportation Needs (04/11/2022)   PRAPARE - Administrator, Civil ServiceTransportation    Lack of Transportation (Medical): No    Lack of Transportation (Non-Medical): No  Physical Activity: Sufficiently Active (04/11/2022)   Exercise Vital Sign    Days of Exercise per Week: 5 days    Minutes of Exercise per Session: 30 min  Stress: No Stress Concern Present (04/11/2022)   [employer] of [employer]ational [employer] - Occupational Stress Questionnaire    Feeling of Stress : Not at all  Social Connections: Moderately Integrated (04/11/2022)   Social Connection and Isolation Panel [NHANES]    Frequency of Communication with Friends and Family: More than three times a week    Frequency of Social Gatherings with Friends and Family: More than three times a week    Attends Religious Services: 1 to 4 times per year    Active Member of [employer] or Organizations: Yes    Attends BankerClub or Organization Meetings: 1 to 4 times per year    Marital Status: Never married  Intimate Partner Violence: Not At Risk (04/11/2022)   Humiliation, Afraid, Rape, and Kick questionnaire    Fear of Current or Ex-Partner: No    Emotionally Abused: No    Physically Abused: No    Sexually Abused: No    Review of Systems  Genitourinary:  Positive for vaginal pain.    PHYSICAL  EXAMINATION:    BP 126/80 (BP Location: Right Arm, Patient Position: Sitting, Cuff Size: Normal)   Pulse (!) 58   Ht 5' 5.25" (1.657  m)   SpO2 99%   BMI 22.62 kg/m     General appearance: alert, cooperative and appears stated age   Pelvic: External genitalia:  no lesions              Urethra:  normal appearing urethra with no masses, tenderness or lesions              Bartholins and Skenes: normal                 Vagina: normal appearing vagina with normal color and discharge, no lesions              Cervix: no lesions                Bimanual Exam:  Uterus:  normal size, contour, position, consistency, mobility, non-tender              Adnexa: no mass, fullness, tenderness             Chaperone was present for exam:  Warren Lacy, CMA  ASSESSMENT  Vaginal burning.  Yeast vaginitis.  Elevated A1C.   PLAN  Wet prep:  yeast present/negative clue cells, negative for trichomonas.  Urinalysis:  sg 1.025, ph 5.0, NS WBC, 0 - 2 RBC, 0 - 5 squams, NS bacteria, few yeast noted.  Reflex urine culture.  Rx for Terazol 3.  We discussed importance of good blood sugar control through diet and exercise .    An After Visit Summary was printed and given to the patient.  ______ minutes face to face time of which over 50% was spent in counseling.

## 2023-03-09 NOTE — Patient Instructions (Signed)

## 2023-03-11 ENCOUNTER — Other Ambulatory Visit: Payer: Self-pay | Admitting: Obstetrics and Gynecology

## 2023-03-11 LAB — URINALYSIS, COMPLETE W/RFL CULTURE
Bilirubin Urine: NEGATIVE
Glucose, UA: NEGATIVE
Hyaline Cast: NONE SEEN /LPF
Ketones, ur: NEGATIVE
Leukocyte Esterase: NEGATIVE
Specific Gravity, Urine: 1.025 (ref 1.001–1.035)
pH: 5 (ref 5.0–8.0)

## 2023-03-11 LAB — URINE CULTURE
MICRO NUMBER:: 14794423
SPECIMEN QUALITY:: ADEQUATE

## 2023-03-11 MED ORDER — SULFAMETHOXAZOLE-TRIMETHOPRIM 800-160 MG PO TABS
1.0000 | ORAL_TABLET | Freq: Two times a day (BID) | ORAL | 0 refills | Status: DC
Start: 2023-03-11 — End: 2023-10-01

## 2023-03-18 ENCOUNTER — Encounter: Payer: Self-pay | Admitting: Obstetrics and Gynecology

## 2023-03-18 ENCOUNTER — Ambulatory Visit (INDEPENDENT_AMBULATORY_CARE_PROVIDER_SITE_OTHER): Payer: Medicare Other | Admitting: Obstetrics and Gynecology

## 2023-03-18 VITALS — BP 124/72 | HR 52 | Ht 62.5 in | Wt 137.0 lb

## 2023-03-18 DIAGNOSIS — R300 Dysuria: Secondary | ICD-10-CM | POA: Diagnosis not present

## 2023-03-18 DIAGNOSIS — R350 Frequency of micturition: Secondary | ICD-10-CM | POA: Diagnosis not present

## 2023-03-18 DIAGNOSIS — N9089 Other specified noninflammatory disorders of vulva and perineum: Secondary | ICD-10-CM

## 2023-03-18 LAB — URINALYSIS, COMPLETE W/RFL CULTURE
Bacteria, UA: NONE SEEN /HPF
Bilirubin Urine: NEGATIVE
Glucose, UA: NEGATIVE
Hgb urine dipstick: NEGATIVE
Hyaline Cast: NONE SEEN /LPF
Ketones, ur: NEGATIVE
Leukocyte Esterase: NEGATIVE
Nitrites, Initial: NEGATIVE
Protein, ur: NEGATIVE
RBC / HPF: NONE SEEN /HPF (ref 0–2)
Specific Gravity, Urine: 1.02 (ref 1.001–1.035)
WBC, UA: NONE SEEN /HPF (ref 0–5)
pH: 5 (ref 5.0–8.0)

## 2023-03-18 LAB — WET PREP FOR TRICH, YEAST, CLUE

## 2023-03-18 LAB — NO CULTURE INDICATED

## 2023-03-18 NOTE — Progress Notes (Signed)
GYNECOLOGY  VISIT   HPI: 67 y.o.   Single  Caucasian  female   G0P0000 with No LMP recorded. Patient is postmenopausal.   here for  still irritation/burning on labia and irritation with urinating.   Having external irritation.   Tx for yeast with Terazol 3, starting 03/10/23.  She used it for 4 nights.   Tx also for Enterobacter UTI with Bactrim DS for 3 days.  She started this soon after she started treating for yeast.   Usually uses vaginal estrogen cream internally and externally.   She stopped using the vaginal estrogen while she is treating for the yeast and UTI infection.   GYNECOLOGIC HISTORY: No LMP recorded. Patient is postmenopausal. Contraception:  PMP Menopausal hormone therapy:  estrace Last mammogram:  02/20/23 Breast Density Category B, BI-RADS CATEGORY 1 Neg  Last pap smear:   02/24/22 neg, 07/18/19 neg: HR HPV neg         OB History     Gravida  0   Para  0   Term  0   Preterm  0   AB  0   Living  0      SAB  0   IAB  0   Ectopic  0   Multiple  0   Live Births  0              Patient Active Problem List   Diagnosis Date Noted   Age-related osteoporosis with current pathological fracture 12/22/2022   Dyslipidemia, goal LDL below 130 12/18/2021   Encounter for general adult medical examination with abnormal findings 12/03/2020   Psychophysiological insomnia 12/03/2020   Diuretic-induced hypokalemia 07/18/2019   Essential hypertension 07/07/2019    Past Medical History:  Diagnosis Date   Allergic rhinitis    Asthma    Elevated hemoglobin A1c 12/23/2022   6.1   Hypertension    Osteoporosis 2023   spine and hip    Past Surgical History:  Procedure Laterality Date   NASAL SEPTUM SURGERY     TONSILLECTOMY      Current Outpatient Medications  Medication Sig Dispense Refill   alendronate (FOSAMAX) 70 MG tablet TAKE 1 TABLET (70 MG TOTAL) BY MOUTH EVERY 7 DAYS WITH FULL GLASS WATER ON EMPTY STOMACH 12 tablet 0   Cephalexin 250 MG  tablet as needed.     clobetasol ointment (TEMOVATE) 0.05 % SMARTSIG:Topical 2-3 Times Weekly     estradiol (ESTRACE) 0.1 MG/GM vaginal cream Place 1 g vaginally 3 (three) times a week. 42.5 g 4   eszopiclone 3 MG TABS Take 1 tablet (3 mg total) by mouth at bedtime. Take immediately before bedtime 90 tablet 0   indapamide (LOZOL) 1.25 MG tablet TAKE 1 TABLET BY MOUTH EVERY DAY 90 tablet 1   irbesartan (AVAPRO) 150 MG tablet TAKE 1 TABLET BY MOUTH EVERY DAY 90 tablet 1   NON FORMULARY Green Miracle  1 scoop a day     NON FORMULARY 5 billion CFU 6 strains (vegan based probiotic)     potassium chloride (KLOR-CON) 8 MEQ tablet TAKE 2 TABLETS (16 MEQ TOTAL) BY MOUTH 2 (TWO) TIMES DAILY. 360 tablet 0   sulfamethoxazole-trimethoprim (BACTRIM DS) 800-160 MG tablet Take 1 tablet by mouth 2 (two) times daily. One PO BID x 3 days 6 tablet 0   zinc gluconate 50 MG tablet Take 50 mg by mouth as needed.     terconazole (TERAZOL 3) 0.8 % vaginal cream Place 1 applicator vaginally at  bedtime. Place nightly for 3 nights. (Patient not taking: Reported on 03/18/2023) 20 g 0   No current facility-administered medications for this visit.     ALLERGIES: Patient has no known allergies.  Family History  Problem Relation Age of Onset   Coronary artery disease Mother    Coronary artery disease Father    Prostate cancer Father    Diabetes Father        AOD   Colon cancer Neg Hx    Esophageal cancer Neg Hx    Rectal cancer Neg Hx    Stomach cancer Neg Hx     Social History   Socioeconomic History   Marital status: Single    Spouse name: Not on file   Number of children: Not on file   Years of education: Not on file   Highest education level: Not on file  Occupational History   Occupation: Works full time    Employer: [employer]  Tobacco Use   Smoking status: Never    Passive exposure: Past   Smokeless tobacco: Never   Tobacco comments:    occasional smoker 20+ years ago. Social smoker.   Vaping Use   Vaping Use: Never used  Substance and Sexual Activity   Alcohol use: Yes    Alcohol/week: 2.0 standard drinks of alcohol    Types: 2 Glasses of wine per week   Drug use: No   Sexual activity: Yes    Birth control/protection: Post-menopausal    Comment: First IC >16y/o, <5 Partners, DES-neg  Other Topics Concern   Not on file  Social History Narrative   Not on file   Social Determinants of Health   Financial Resource Strain: Low Risk  (04/11/2022)   Overall Financial Resource Strain (CARDIA)    Difficulty of Paying Living Expenses: Not hard at all  Food Insecurity: No Food Insecurity (04/11/2022)   Hunger Vital Sign    Worried About Running Out of Food in the Last Year: Never true    Ran Out of Food in the Last Year: Never true  Transportation Needs: No Transportation Needs (04/11/2022)   PRAPARE - Administrator, Civil Service (Medical): No    Lack of Transportation (Non-Medical): No  Physical Activity: Sufficiently Active (04/11/2022)   Exercise Vital Sign    Days of Exercise per Week: 5 days    Minutes of Exercise per Session: 30 min  Stress: No Stress Concern Present (04/11/2022)   [employer] of Occupational Health - Occupational Stress Questionnaire    Feeling of Stress : Not at all  Social Connections: Moderately Integrated (04/11/2022)   Social Connection and Isolation Panel [NHANES]    Frequency of Communication with Friends and Family: More than three times a week    Frequency of Social Gatherings with Friends and Family: More than three times a week    Attends Religious Services: 1 to 4 times per year    Active Member of [employer] or Organizations: Yes    Attends Banker Meetings: 1 to 4 times per year    Marital Status: Never married  Intimate Partner Violence: Not At Risk (04/11/2022)   Humiliation, Afraid, Rape, and Kick questionnaire    Fear of Current or Ex-Partner: No    Emotionally Abused: No    Physically Abused: No     Sexually Abused: No    Review of Systems  All other systems reviewed and are negative.   PHYSICAL EXAMINATION:    BP 124/72 (  BP Location: Left Arm, Patient Position: Sitting, Cuff Size: Normal)   Pulse (!) 52   Ht 5' 2.5" (1.588 m)   Wt 137 lb (62.1 kg)   SpO2 99%   BMI 24.66 kg/m     General appearance: alert, cooperative and appears stated age  Pelvic: External genitalia:  no lesions              Urethra:  normal appearing urethra with no masses, tenderness or lesions              Bartholins and Skenes: normal                 Vagina: normal appearing vagina with normal color and discharge, no lesions              Cervix: no lesions                Bimanual Exam:  Uterus:  normal size, contour, position, consistency, mobility, non-tender              Adnexa: no mass, fullness, tenderness             Chaperone was present for exam:  Warren Lacy, CMA  ASSESSMENT  Dysuria.  Urinary frequency.  Vulvar irritation.   PLAN  Urinalysis:  sg 1.020, pH 5.0, all negative.  Patient requests a formal UC to be sent.  Wet prep:  negative.  Restart estrogen cream.    20 min  total time was spent for this patient encounter, including preparation, face-to-face counseling with the patient, coordination of care, and documentation of the encounter.

## 2023-03-19 LAB — URINE CULTURE
MICRO NUMBER:: 14837405
SPECIMEN QUALITY:: ADEQUATE

## 2023-03-24 DIAGNOSIS — S81851A Open bite, right lower leg, initial encounter: Secondary | ICD-10-CM | POA: Diagnosis not present

## 2023-03-26 ENCOUNTER — Telehealth: Payer: Self-pay | Admitting: Internal Medicine

## 2023-03-26 DIAGNOSIS — I10 Essential (primary) hypertension: Secondary | ICD-10-CM

## 2023-03-26 MED ORDER — IRBESARTAN 150 MG PO TABS
150.0000 mg | ORAL_TABLET | Freq: Every day | ORAL | 0 refills | Status: DC
Start: 2023-03-26 — End: 2023-06-20

## 2023-03-26 NOTE — Telephone Encounter (Signed)
Prescription Request  03/26/2023  LOV: 12/22/2022  What is the name of the medication or equipment? irbesartan (AVAPRO) 150 MG tablet   Have you contacted your pharmacy to request a refill? Yes   Which pharmacy would you like this sent to?  [id number] [place], [place] - [address] [place] [address] Phone: [phone] Fax: [phone]    Patient notified that their request is being sent to the clinical staff for review and that they should receive a response within 2 business days.   Please advise at Mobile [phone] (mobile)

## 2023-03-31 ENCOUNTER — Telehealth: Payer: Self-pay

## 2023-03-31 NOTE — Telephone Encounter (Signed)
Contacted Yvette Walton to schedule their annual wellness visit. Appointment made for 04/13/23.  Yvette Walton, CMA (AAMA)  [facility] [phone]

## 2023-04-07 ENCOUNTER — Ambulatory Visit: Payer: Medicare Other | Admitting: Obstetrics and Gynecology

## 2023-04-07 ENCOUNTER — Encounter: Payer: Self-pay | Admitting: Obstetrics and Gynecology

## 2023-04-07 VITALS — BP 124/78 | HR 52 | Ht 62.5 in | Wt 137.0 lb

## 2023-04-07 DIAGNOSIS — N761 Subacute and chronic vaginitis: Secondary | ICD-10-CM | POA: Diagnosis not present

## 2023-04-07 LAB — WET PREP FOR TRICH, YEAST, CLUE

## 2023-04-07 NOTE — Progress Notes (Unsigned)
GYNECOLOGY  VISIT   HPI: 67 y.o.   Single  Caucasian  female   G0P0000 with No LMP recorded. Patient is postmenopausal.   here for   infection. Pt feels like this is a yeast infection for one week. Took OTC yeast inf 10 days ago. Pt reported irritation and slight odor. Noticed swelling and some stickiness.  Some discharge.  Reports symptoms have improved.   Using vaginal estradiol cream last week and did 3 treatments last week.  Last dosage was 2 days ago.   UC 03/18/23 - negative.  Wet prep 03/09/23 - yeast.   UC 03/09/23 - enterobacter tx with Bactrim DS.  Nuswab 12/23/22 - negative for vaginitis.   UC 12/23/22 - negative.  Wet prep 11/20/22 - negative.   Wet prep 11/05/22 - yeast.   Wet prep 10/28/22 - yeast.   UC 10/28/22- negative.   Wet prep 10/09/22 - yeast.   UC 10/09/22 - negative.   Wet prep 06/20/22 - negative.   UC 06/20/22 - E Coli.   UC 04/01/22 - E Coli.   She sees Dr. Arita Miss from urology for recurrent UTI.  She takes a probiotic, D Mannose and cranberry extract.  Also using Keflex for post coital URI prevention.   She has been with her current partner for 12 years.  HIV negative 07/07/19.   She states she has a low heart rate that can run in the 40s.   GYNECOLOGIC HISTORY: No LMP recorded. Patient is postmenopausal. Contraception:  PMP Menopausal hormone therapy:  estrace Last mammogram:  02/20/23 Breast Density Category B, BI-RADS CATEGORY 1 Neg  Last pap smear:   02/24/22 neg, 07/18/19 neg: HR HPV neg                OB History     Gravida  0   Para  0   Term  0   Preterm  0   AB  0   Living  0      SAB  0   IAB  0   Ectopic  0   Multiple  0   Live Births  0              Patient Active Problem List   Diagnosis Date Noted   Age-related osteoporosis with current pathological fracture 12/22/2022   Dyslipidemia, goal LDL below 130 12/18/2021   Encounter for general adult medical examination with abnormal findings 12/03/2020    Psychophysiological insomnia 12/03/2020   Diuretic-induced hypokalemia 07/18/2019   Essential hypertension 07/07/2019    Past Medical History:  Diagnosis Date   Allergic rhinitis    Asthma    Elevated hemoglobin A1c 12/23/2022   6.1   Hypertension    Osteoporosis 2023   spine and hip    Past Surgical History:  Procedure Laterality Date   NASAL SEPTUM SURGERY     TONSILLECTOMY      Current Outpatient Medications  Medication Sig Dispense Refill   alendronate (FOSAMAX) 70 MG tablet TAKE 1 TABLET (70 MG TOTAL) BY MOUTH EVERY 7 DAYS WITH FULL GLASS WATER ON EMPTY STOMACH 12 tablet 0   Cephalexin 250 MG tablet as needed.     clobetasol ointment (TEMOVATE) 0.05 % SMARTSIG:Topical 2-3 Times Weekly     estradiol (ESTRACE) 0.1 MG/GM vaginal cream Place 1 g vaginally 3 (three) times a week. 42.5 g 4   eszopiclone 3 MG TABS Take 1 tablet (3 mg total) by mouth at bedtime. Take immediately before bedtime 90 tablet  0   indapamide (LOZOL) 1.25 MG tablet TAKE 1 TABLET BY MOUTH EVERY DAY 90 tablet 1   irbesartan (AVAPRO) 150 MG tablet Take 1 tablet (150 mg total) by mouth daily. 90 tablet 0   NON FORMULARY Green Miracle  1 scoop a day     NON FORMULARY 5 billion CFU 6 strains (vegan based probiotic)     potassium chloride (KLOR-CON) 8 MEQ tablet TAKE 2 TABLETS (16 MEQ TOTAL) BY MOUTH 2 (TWO) TIMES DAILY. 360 tablet 0   sulfamethoxazole-trimethoprim (BACTRIM DS) 800-160 MG tablet Take 1 tablet by mouth 2 (two) times daily. One PO BID x 3 days 6 tablet 0   terconazole (TERAZOL 3) 0.8 % vaginal cream Place 1 applicator vaginally at bedtime. Place nightly for 3 nights. 20 g 0   zinc gluconate 50 MG tablet Take 50 mg by mouth as needed.     No current facility-administered medications for this visit.     ALLERGIES: Patient has no known allergies.  Family History  Problem Relation Age of Onset   Coronary artery disease Mother    Coronary artery disease Father    Prostate cancer Father     Diabetes Father        AOD   Colon cancer Neg Hx    Esophageal cancer Neg Hx    Rectal cancer Neg Hx    Stomach cancer Neg Hx     Social History   Socioeconomic History   Marital status: Single    Spouse name: Not on file   Number of children: Not on file   Years of education: Not on file   Highest education level: Not on file  Occupational History   Occupation: Works full time    Employer: [employer]  Tobacco Use   Smoking status: Never    Passive exposure: Past   Smokeless tobacco: Never   Tobacco comments:    occasional smoker 20+ years ago. Social smoker.  Vaping Use   Vaping Use: Never used  Substance and Sexual Activity   Alcohol use: Yes    Alcohol/week: 2.0 standard drinks of alcohol    Types: 2 Glasses of wine per week   Drug use: No   Sexual activity: Yes    Birth control/protection: Post-menopausal    Comment: First IC >16y/o, <5 Partners, DES-neg  Other Topics Concern   Not on file  Social History Narrative   Not on file   Social Determinants of Health   Financial Resource Strain: Low Risk  (04/11/2022)   Overall Financial Resource Strain (CARDIA)    Difficulty of Paying Living Expenses: Not hard at all  Food Insecurity: No Food Insecurity (04/11/2022)   Hunger Vital Sign    Worried About Running Out of Food in the Last Year: Never true    Ran Out of Food in the Last Year: Never true  Transportation Needs: No Transportation Needs (04/11/2022)   PRAPARE - Administrator, Civil Service (Medical): No    Lack of Transportation (Non-Medical): No  Physical Activity: Sufficiently Active (04/11/2022)   Exercise Vital Sign    Days of Exercise per Week: 5 days    Minutes of Exercise per Session: 30 min  Stress: No Stress Concern Present (04/11/2022)   [employer] of Occupational Health - Occupational Stress Questionnaire    Feeling of Stress : Not at all  Social Connections: Moderately Integrated (04/11/2022)   Social Connection and  Isolation Panel [NHANES]    Frequency of  Communication with Friends and Family: More than three times a week    Frequency of Social Gatherings with Friends and Family: More than three times a week    Attends Religious Services: 1 to 4 times per year    Active Member of [employer] or Organizations: Yes    Attends Banker Meetings: 1 to 4 times per year    Marital Status: Never married  Intimate Partner Violence: Not At Risk (04/11/2022)   Humiliation, Afraid, Rape, and Kick questionnaire    Fear of Current or Ex-Partner: No    Emotionally Abused: No    Physically Abused: No    Sexually Abused: No    Review of Systems  Genitourinary:  Positive for vaginal discharge.    PHYSICAL EXAMINATION:    BP 124/78 (BP Location: Right Arm, Patient Position: Sitting, Cuff Size: Normal)   Pulse (!) 52   Ht 5' 2.5" (1.588 m)   Wt 137 lb (62.1 kg)   SpO2 100%   BMI 24.66 kg/m     General appearance: alert, cooperative and appears stated age   Pelvic: External genitalia:  no lesions              Urethra:  normal appearing urethra with no masses, tenderness or lesions              Bartholins and Skenes: normal                 Vagina: normal appearing vagina with normal color and discharge, no lesions              Cervix: no lesions.  Small amount of white fleck like discharge.                 Bimanual Exam:  Uterus:  normal size, contour, position, consistency, mobility, non-tender              Adnexa: no mass, fullness, tenderness          Chaperone was present for exam:  Warren Lacy, CMA  ASSESSMENT  Recurrent vaginitis.   Recurrent yeast on chart review. Bradycardia.   PLAN  Wet prep.    An After Visit Summary was printed and given to the patient.  ______ minutes face to face time of which over 50% was spent in counseling.

## 2023-04-08 ENCOUNTER — Encounter: Payer: Self-pay | Admitting: Obstetrics and Gynecology

## 2023-04-13 ENCOUNTER — Ambulatory Visit (INDEPENDENT_AMBULATORY_CARE_PROVIDER_SITE_OTHER): Payer: Medicare Other

## 2023-04-13 VITALS — Ht 62.5 in | Wt 137.0 lb

## 2023-04-13 DIAGNOSIS — Z0000 Encounter for general adult medical examination without abnormal findings: Secondary | ICD-10-CM

## 2023-04-13 NOTE — Progress Notes (Signed)
I connected with  Yvette Walton on 04/13/23 by a audio enabled telemedicine application and verified that I am speaking with the correct person using two identifiers.  Patient Location: Home  Provider Location: Office/Clinic  I discussed the limitations of evaluation and management by telemedicine. The patient expressed understanding and agreed to proceed.  Patient Medicare AWV questionnaire was completed by the patient on 04/13/2023; I have confirmed that all information answered by patient is correct and no changes since this date.    Subjective:   Yvette Walton is a 67 y.o. female who presents for Medicare Annual (Subsequent) preventive examination.  Review of Systems     Cardiac Risk Factors include: advanced age (>39men, >60 women);dyslipidemia;family history of premature cardiovascular disease;hypertension     Objective:    Today's Vitals   04/13/23 1405  Weight: 137 lb (62.1 kg)  Height: 5' 2.5" (1.588 m)  PainSc: 0-No pain   Body mass index is 24.66 kg/m.     04/13/2023    2:14 PM 04/11/2022   12:07 PM  Advanced Directives  Does Patient Have a Medical Advance Directive? Yes No  Type of Estate agent of [place];Living will   Does patient want to make changes to medical advance directive? No - Patient declined   Copy of Healthcare Power of Attorney in Chart? Yes - validated most recent copy scanned in chart (See row information)     Current Medications (verified) Outpatient Encounter Medications as of 04/13/2023  Medication Sig   alendronate (FOSAMAX) 70 MG tablet TAKE 1 TABLET (70 MG TOTAL) BY MOUTH EVERY 7 DAYS WITH FULL GLASS WATER ON EMPTY STOMACH   Cephalexin 250 MG tablet as needed.   clobetasol ointment (TEMOVATE) 0.05 % SMARTSIG:Topical 2-3 Times Weekly   estradiol (ESTRACE) 0.1 MG/GM vaginal cream Place 1 g vaginally 3 (three) times a week.   eszopiclone 3 MG TABS Take 1 tablet (3 mg total) by mouth at bedtime. Take immediately before  bedtime   indapamide (LOZOL) 1.25 MG tablet TAKE 1 TABLET BY MOUTH EVERY DAY   irbesartan (AVAPRO) 150 MG tablet Take 1 tablet (150 mg total) by mouth daily.   NON FORMULARY Green Miracle  1 scoop a day   NON FORMULARY 5 billion CFU 6 strains (vegan based probiotic)   potassium chloride (KLOR-CON) 8 MEQ tablet TAKE 2 TABLETS (16 MEQ TOTAL) BY MOUTH 2 (TWO) TIMES DAILY.   sulfamethoxazole-trimethoprim (BACTRIM DS) 800-160 MG tablet Take 1 tablet by mouth 2 (two) times daily. One PO BID x 3 days   terconazole (TERAZOL 3) 0.8 % vaginal cream Place 1 applicator vaginally at bedtime. Place nightly for 3 nights.   zinc gluconate 50 MG tablet Take 50 mg by mouth as needed.   No facility-administered encounter medications on file as of 04/13/2023.    Allergies (verified) Patient has no known allergies.   History: Past Medical History:  Diagnosis Date   Allergic rhinitis    Asthma    Bradycardia    Elevated hemoglobin A1c 12/23/2022   6.1   Hypertension    Osteoporosis 2023   spine and hip   Past Surgical History:  Procedure Laterality Date   NASAL SEPTUM SURGERY     TONSILLECTOMY     Family History  Problem Relation Age of Onset   Coronary artery disease Mother    Coronary artery disease Father    Prostate cancer Father    Diabetes Father        AOD   Colon cancer  Neg Hx    Esophageal cancer Neg Hx    Rectal cancer Neg Hx    Stomach cancer Neg Hx    Social History   Socioeconomic History   Marital status: Single    Spouse name: Not on file   Number of children: Not on file   Years of education: Not on file   Highest education level: Not on file  Occupational History   Occupation: Works full time    Employer: [employer]  Tobacco Use   Smoking status: Never    Passive exposure: Past   Smokeless tobacco: Never   Tobacco comments:    occasional smoker 20+ years ago. Social smoker.  Vaping Use   Vaping Use: Never used  Substance and Sexual Activity    Alcohol use: Yes    Alcohol/week: 2.0 standard drinks of alcohol    Types: 2 Glasses of wine per week   Drug use: No   Sexual activity: Yes    Birth control/protection: Post-menopausal    Comment: First IC >16y/o, <5 Partners, DES-neg  Other Topics Concern   Not on file  Social History Narrative   Not on file   Social Determinants of Health   Financial Resource Strain: Low Risk  (04/13/2023)   Overall Financial Resource Strain (CARDIA)    Difficulty of Paying Living Expenses: Not hard at all  Food Insecurity: No Food Insecurity (04/13/2023)   Hunger Vital Sign    Worried About Running Out of Food in the Last Year: Never true    Ran Out of Food in the Last Year: Never true  Transportation Needs: No Transportation Needs (04/13/2023)   PRAPARE - Administrator, Civil Service (Medical): No    Lack of Transportation (Non-Medical): No  Physical Activity: Sufficiently Active (04/13/2023)   Exercise Vital Sign    Days of Exercise per Week: 5 days    Minutes of Exercise per Session: 40 min  Stress: No Stress Concern Present (04/13/2023)   [employer] of Occupational Health - Occupational Stress Questionnaire    Feeling of Stress : Not at all  Social Connections: Moderately Integrated (04/13/2023)   Social Connection and Isolation Panel [NHANES]    Frequency of Communication with Friends and Family: More than three times a week    Frequency of Social Gatherings with Friends and Family: Three times a week    Attends Religious Services: 1 to 4 times per year    Active Member of Clubs or Organizations: Yes    Attends Banker Meetings: 1 to 4 times per year    Marital Status: Never married    Tobacco Counseling Counseling given: Not Answered Tobacco comments: occasional smoker 20+ years ago. Social smoker.   Clinical Intake:  Pre-visit preparation completed: Yes  Pain : No/denies pain Pain Score: 0-No pain     BMI - recorded: 24.66 Nutritional  Status: BMI of 19-24  Normal Nutritional Risks: None Diabetes: No  How often do you need to have someone help you when you read instructions, pamphlets, or other written materials from your doctor or pharmacy?: 1 - Never What is the last grade level you completed in school?: HSG  Diabetic? No  Interpreter Needed?: No  Information entered by :: Susie Cassette, LPN.   Activities of Daily Living    04/13/2023    2:16 PM 04/13/2023    7:42 AM  In your present state of health, do you have any difficulty performing the following activities:  Hearing? 0 0  Vision? 0 0  Difficulty concentrating or making decisions? 0 0  Walking or climbing stairs? 0 0  Dressing or bathing? 0 0  Doing errands, shopping? 0 0  Preparing Food and eating ? N N  Using the Toilet? N N  In the past six months, have you accidently leaked urine? N N  Do you have problems with loss of bowel control? N N  Managing your Medications? N N  Managing your Finances? N N  Housekeeping or managing your Housekeeping? N N    Patient Care Team: Etta Grandchild, MD as PCP - General Patton Salles, MD as Consulting Physician (Obstetrics and Gynecology)  Indicate any recent Medical Services you may have received from other than [facility] providers in the past year (date may be approximate).     Assessment:   This is a routine wellness examination for Yvette.  Hearing/Vision screen Hearing Screening - Comments:: Patient denied any hearing difficulty.   No hearing aids.  Vision Screening - Comments:: Patient does not wear any corrective lenses/contacts.   No annual eye exam.  Dietary issues and exercise activities discussed: Current Exercise Habits: Home exercise routine;Structured exercise class, Type of exercise: walking;treadmill;stretching;strength training/weights, Time (Minutes): 40, Frequency (Times/Week): 5, Weekly Exercise (Minutes/Week): 200, Intensity: Moderate, Exercise limited by: orthopedic  condition(s)   Goals Addressed             This Visit's Progress    My goal for 2024 is to improve my eating, cooking more at home and eating out less.  Continue to go to the gym and doing my osteoporosis workout on  you-tube.        Depression Screen    04/13/2023    2:15 PM 04/11/2022   11:37 AM 12/18/2021    8:08 AM 04/24/2020    1:32 PM 07/07/2019    4:44 PM 08/26/2017    3:31 PM  PHQ 2/9 Scores  PHQ - 2 Score 0 0 0 0 0 0  PHQ- 9 Score 0         Fall Risk    04/13/2023    2:15 PM 04/13/2023    7:42 AM 04/11/2022   11:37 AM 12/18/2021    8:08 AM 04/24/2020    1:32 PM  Fall Risk   Falls in the past year? 0 0 0 0 0  Number falls in past yr: 0 0 0  0  Injury with Fall? 0  0  0  Risk for fall due to : No Fall Risks  No Fall Risks  No Fall Risks  Follow up Falls prevention discussed  Falls evaluation completed  Falls evaluation completed    FALL RISK PREVENTION PERTAINING TO THE HOME:  Any stairs in or around the home? Yes  If so, are there any without handrails? No  Home free of loose throw rugs in walkways, pet beds, electrical cords, etc? Yes  Adequate lighting in your home to reduce risk of falls? Yes   ASSISTIVE DEVICES UTILIZED TO PREVENT FALLS:  Life alert? No  Use of a cane, walker or w/c? No  Grab bars in the bathroom? Yes  Shower chair or bench in shower? Yes  Elevated toilet seat or a handicapped toilet? Yes   TIMED UP AND GO:  Was the test performed? No . Telephonic Visit  Cognitive Function:        04/13/2023    2:17 PM 04/11/2022   12:09 PM  6CIT Screen  What Year? 0 points 0 points  What month? 0 points 0 points  What time? 0 points 0 points  Count back from 20 0 points 0 points  Months in reverse 0 points 0 points  Repeat phrase 0 points 0 points  Total Score 0 points 0 points    Immunizations Immunization History  Administered Date(s) Administered   Covid-19, Mrna,Vaccine(Spikevax)44yrs and older 09/17/2022   Fluad Quad(high Dose 65+)  08/18/2022, 08/18/2022   H1N1 01/01/2009   Influenza Split 08/31/2012, 09/22/2019, 08/27/2020   Influenza Whole 08/31/2010   Influenza, High Dose Seasonal PF 09/17/2022   Influenza,inj,Quad PF,6+ Mos 11/04/2018, 09/22/2019   Influenza-Unspecified 09/21/2021   PFIZER Comirnaty(Gray Top)Covid-19 Tri-Sucrose Vaccine 08/18/2022   PFIZER(Purple Top)SARS-COV-2 Vaccination 03/11/2020, 04/02/2020, 11/11/2020   PNEUMOCOCCAL CONJUGATE-20 06/12/2021   Pfizer Covid-19 Vaccine Bivalent Booster 6yrs & up 12/19/2021, 08/18/2022   Pneumococcal Polysaccharide-23 12/01/2005, 03/22/2014   Tdap 05/14/2006, 08/26/2017   Zoster Recombinat (Shingrix) 12/02/2018, 04/26/2019    TDAP status: Up to date  Flu Vaccine status: Up to date  Pneumococcal vaccine status: Up to date  Covid-19 vaccine status: Information provided on how to obtain vaccines.   Qualifies for Shingles Vaccine? Yes   Zostavax completed No   Shingrix Completed?: Yes  Screening Tests Health Maintenance  Topic Date Due   COVID-19 Vaccine (6 - 2023-24 season) 11/12/2022   INFLUENZA VACCINE  07/02/2023   COLONOSCOPY (Pts 45-54yrs Insurance coverage will need to be confirmed)  10/25/2023   Medicare Annual Wellness (AWV)  04/12/2024   MAMMOGRAM  02/19/2025   DTaP/Tdap/Td (3 - Td or Tdap) 08/27/2027   Pneumonia Vaccine 84+ Years old  Completed   DEXA SCAN  Completed   Hepatitis C Screening  Completed   Zoster Vaccines- Shingrix  Completed   HPV VACCINES  Aged Out    Health Maintenance  Health Maintenance Due  Topic Date Due   COVID-19 Vaccine (6 - 2023-24 season) 11/12/2022    Colorectal cancer screening: Type of screening: Colonoscopy. Completed 10/24/2013. Repeat every 10 years  Mammogram status: Completed 02/20/2023. Repeat every year  Bone Density status: Completed 08/27/2022. Results reflect: Bone density results: OSTEOPOROSIS. Repeat every 2 years.  Lung Cancer Screening: (Low Dose CT Chest recommended if Age 30-80  years, 30 pack-year currently smoking OR have quit w/in 15years.) does not qualify.   Lung Cancer Screening Referral: No  Additional Screening:  Hepatitis C Screening: does qualify; Completed 07/07/2019  Vision Screening: Recommended annual ophthalmology exams for early detection of glaucoma and other disorders of the eye. Is the patient up to date with their annual eye exam?  No  Who is the provider or what is the name of the office in which the patient attends annual eye exams? No eye doctor If pt is not established with a provider, would they like to be referred to a provider to establish care? No .   Dental Screening: Recommended annual dental exams for proper oral hygiene  Community Resource Referral / Chronic Care Management: CRR required this visit?  No   CCM required this visit?  No      Plan:     I have personally reviewed and noted the following in the patient's chart:   Medical and social history Use of alcohol, tobacco or illicit drugs  Current medications and supplements including opioid prescriptions. Patient is not currently taking opioid prescriptions. Functional ability and status Nutritional status Physical activity Advanced directives List of other physicians Hospitalizations, surgeries, and ER visits in previous 12 months Vitals  Screenings to include cognitive, depression, and falls Referrals and appointments  In addition, I have reviewed and discussed with patient certain preventive protocols, quality metrics, and best practice recommendations. A written personalized care plan for preventive services as well as general preventive health recommendations were provided to patient.     Mickeal Needy, LPN   [id number]/61/0960   Nurse Notes:  Normal cognitive status assessed by direct observation via telephone conversation by this Nurse Health Advisor. No abnormalities found.

## 2023-04-13 NOTE — Patient Instructions (Addendum)
Yvette Walton , Thank you for taking time to come for your Medicare Wellness Visit. I appreciate your ongoing commitment to your health goals. Please review the following plan we discussed and let me know if I can assist you in the future.   These are the goals we discussed:  Goals      My goal for 2024 is to improve my eating, cooking more at home and eating out less.  Continue to go to the gym and doing my osteoporosis workout on  you-tube.        This is a list of the screening recommended for you and due dates:  Health Maintenance  Topic Date Due   COVID-19 Vaccine (6 - 2023-24 season) 11/12/2022   Flu Shot  07/02/2023   Colon Cancer Screening  10/25/2023   Medicare Annual Wellness Visit  04/12/2024   Mammogram  02/19/2025   DTaP/Tdap/Td vaccine (3 - Td or Tdap) 08/27/2027   Pneumonia Vaccine  Completed   DEXA scan (bone density measurement)  Completed   Hepatitis C Screening: USPSTF Recommendation to screen - Ages 67-79 yo.  Completed   Zoster (Shingles) Vaccine  Completed   HPV Vaccine  Aged Out    Advanced directives: Yes; documents are on file.  Conditions/risks identified: Yes  Next appointment: Follow up in one year for your annual wellness visit.   Preventive Care 67 Years and Older, Female Preventive care refers to lifestyle choices and visits with your health care provider that can promote health and wellness. What does preventive care include? A yearly physical exam. This is also called an annual well check. Dental exams once or twice a year. Routine eye exams. Ask your health care provider how often you should have your eyes checked. Personal lifestyle choices, including: Daily care of your teeth and gums. Regular physical activity. Eating a healthy diet. Avoiding tobacco and drug use. Limiting alcohol use. Practicing safe sex. Taking low-dose aspirin every day. Taking vitamin and mineral supplements as recommended by your health care provider. What happens  during an annual well check? The services and screenings done by your health care provider during your annual well check will depend on your age, overall health, lifestyle risk factors, and family history of disease. Counseling  Your health care provider may ask you questions about your: Alcohol use. Tobacco use. Drug use. Emotional well-being. Home and relationship well-being. Sexual activity. Eating habits. History of falls. Memory and ability to understand (cognition). Work and work Astronomer. Reproductive health. Screening  You may have the following tests or measurements: Height, weight, and BMI. Blood pressure. Lipid and cholesterol levels. These may be checked every 5 years, or more frequently if you are over 32 years old. Skin check. Lung cancer screening. You may have this screening every year starting at age 85 if you have a 30-pack-year history of smoking and currently smoke or have quit within the past 15 years. Fecal occult blood test (FOBT) of the stool. You may have this test every year starting at age 33. Flexible sigmoidoscopy or colonoscopy. You may have a sigmoidoscopy every 5 years or a colonoscopy every 10 years starting at age 29. Hepatitis C blood test. Hepatitis B blood test. Sexually transmitted disease (STD) testing. Diabetes screening. This is done by checking your blood sugar (glucose) after you have not eaten for a while (fasting). You may have this done every 1-3 years. Bone density scan. This is done to screen for osteoporosis. You may have this done starting at age  65. Mammogram. This may be done every 1-2 years. Talk to your health care provider about how often you should have regular mammograms. Talk with your health care provider about your test results, treatment options, and if necessary, the need for more tests. Vaccines  Your health care provider may recommend certain vaccines, such as: Influenza vaccine. This is recommended every  year. Tetanus, diphtheria, and acellular pertussis (Tdap, Td) vaccine. You may need a Td booster every 10 years. Zoster vaccine. You may need this after age 67. Pneumococcal 13-valent conjugate (PCV13) vaccine. One dose is recommended after age 76. Pneumococcal polysaccharide (PPSV23) vaccine. One dose is recommended after age 23. Talk to your health care provider about which screenings and vaccines you need and how often you need them. This information is not intended to replace advice given to you by your health care provider. Make sure you discuss any questions you have with your health care provider. Document Released: 12/14/2015 Document Revised: 08/06/2016 Document Reviewed: 09/18/2015 Elsevier Interactive Patient Education  2017 [place] Prevention in the Home Falls can cause injuries. They can happen to people of all ages. There are many things you can do to make your home safe and to help prevent falls. What can I do on the outside of my home? Regularly fix the edges of walkways and driveways and fix any cracks. Remove anything that might make you trip as you walk through a door, such as a raised step or threshold. Trim any bushes or trees on the path to your home. Use bright outdoor lighting. Clear any walking paths of anything that might make someone trip, such as rocks or tools. Regularly check to see if handrails are loose or broken. Make sure that both sides of any steps have handrails. Any raised decks and porches should have guardrails on the edges. Have any leaves, snow, or ice cleared regularly. Use sand or salt on walking paths during winter. Clean up any spills in your garage right away. This includes oil or grease spills. What can I do in the bathroom? Use night lights. Install grab bars by the toilet and in the tub and shower. Do not use towel bars as grab bars. Use non-skid mats or decals in the tub or shower. If you need to sit down in the shower, use a  plastic, non-slip stool. Keep the floor dry. Clean up any water that spills on the floor as soon as it happens. Remove soap buildup in the tub or shower regularly. Attach bath mats securely with double-sided non-slip rug tape. Do not have throw rugs and other things on the floor that can make you trip. What can I do in the bedroom? Use night lights. Make sure that you have a light by your bed that is easy to reach. Do not use any sheets or blankets that are too big for your bed. They should not hang down onto the floor. Have a firm chair that has side arms. You can use this for support while you get dressed. Do not have throw rugs and other things on the floor that can make you trip. What can I do in the kitchen? Clean up any spills right away. Avoid walking on wet floors. Keep items that you use a lot in easy-to-reach places. If you need to reach something above you, use a strong step stool that has a grab bar. Keep electrical cords out of the way. Do not use floor polish or wax that makes floors slippery.  If you must use wax, use non-skid floor wax. Do not have throw rugs and other things on the floor that can make you trip. What can I do with my stairs? Do not leave any items on the stairs. Make sure that there are handrails on both sides of the stairs and use them. Fix handrails that are broken or loose. Make sure that handrails are as long as the stairways. Check any carpeting to make sure that it is firmly attached to the stairs. Fix any carpet that is loose or worn. Avoid having throw rugs at the top or bottom of the stairs. If you do have throw rugs, attach them to the floor with carpet tape. Make sure that you have a light switch at the top of the stairs and the bottom of the stairs. If you do not have them, ask someone to add them for you. What else can I do to help prevent falls? Wear shoes that: Do not have high heels. Have rubber bottoms. Are comfortable and fit you  well. Are closed at the toe. Do not wear sandals. If you use a stepladder: Make sure that it is fully opened. Do not climb a closed stepladder. Make sure that both sides of the stepladder are locked into place. Ask someone to hold it for you, if possible. Clearly mark and make sure that you can see: Any grab bars or handrails. First and last steps. Where the edge of each step is. Use tools that help you move around (mobility aids) if they are needed. These include: Canes. Walkers. Scooters. Crutches. Turn on the lights when you go into a dark area. Replace any light bulbs as soon as they burn out. Set up your furniture so you have a clear path. Avoid moving your furniture around. If any of your floors are uneven, fix them. If there are any pets around you, be aware of where they are. Review your medicines with your doctor. Some medicines can make you feel dizzy. This can increase your chance of falling. Ask your doctor what other things that you can do to help prevent falls. This information is not intended to replace advice given to you by your health care provider. Make sure you discuss any questions you have with your health care provider. Document Released: 09/13/2009 Document Revised: 04/24/2016 Document Reviewed: 12/22/2014 Elsevier Interactive Patient Education  2017 [employer].

## 2023-04-17 ENCOUNTER — Other Ambulatory Visit: Payer: Self-pay | Admitting: Obstetrics and Gynecology

## 2023-04-17 NOTE — Telephone Encounter (Signed)
Medication refill request: fosamax Last AEX:  02-24-22 Next AEX: 05-26-23 Last MMG (if hormonal medication request): 02-20-23 category b density birads 1:neg Refill authorized: please approve until aex if appropriate

## 2023-04-22 DIAGNOSIS — N3020 Other chronic cystitis without hematuria: Secondary | ICD-10-CM | POA: Diagnosis not present

## 2023-05-12 ENCOUNTER — Other Ambulatory Visit: Payer: Self-pay | Admitting: Internal Medicine

## 2023-05-12 DIAGNOSIS — E876 Hypokalemia: Secondary | ICD-10-CM

## 2023-05-12 DIAGNOSIS — I10 Essential (primary) hypertension: Secondary | ICD-10-CM

## 2023-05-12 NOTE — Progress Notes (Deleted)
67 y.o. G0P0000 Single Caucasian female here for annual exam.    PCP:     No LMP recorded. Patient is postmenopausal.           Sexually active: {yes no:314532}  The current method of family planning is post menopausal status.    Exercising: {yes no:314532}  {types:19826} Smoker:  no  Health Maintenance: Pap:  02/24/22 neg, 07/18/19 neg History of abnormal Pap:  no MMG:  02/20/23 Breast Density Cat B,BI-RADS CAT 1 neg Colonoscopy:  10/24/13 BMD:   05/02/14 Result  osteopenia TDaP:  08/26/17 Gardasil:   no HIV: 07/07/19 NR Hep C: 07/07/19 NR Screening Labs:  Hb today: ***, Urine today: ***   reports that she has never smoked. She has been exposed to tobacco smoke. She has never used smokeless tobacco. She reports current alcohol use of about 2.0 standard drinks of alcohol per week. She reports that she does not use drugs.  Past Medical History:  Diagnosis Date   Allergic rhinitis    Asthma    Bradycardia    Elevated hemoglobin A1c 12/23/2022   6.1   Hypertension    Osteoporosis 2023   spine and hip    Past Surgical History:  Procedure Laterality Date   NASAL SEPTUM SURGERY     TONSILLECTOMY      Current Outpatient Medications  Medication Sig Dispense Refill   alendronate (FOSAMAX) 70 MG tablet TAKE 1 TABLET (70 MG TOTAL) BY MOUTH EVERY 7 DAYS WITH FULL GLASS WATER ON EMPTY STOMACH 12 tablet 0   Cephalexin 250 MG tablet as needed.     clobetasol ointment (TEMOVATE) 0.05 % SMARTSIG:Topical 2-3 Times Weekly     estradiol (ESTRACE) 0.1 MG/GM vaginal cream Place 1 g vaginally 3 (three) times a week. 42.5 g 4   eszopiclone 3 MG TABS Take 1 tablet (3 mg total) by mouth at bedtime. Take immediately before bedtime 90 tablet 0   indapamide (LOZOL) 1.25 MG tablet TAKE 1 TABLET BY MOUTH EVERY DAY 90 tablet 1   irbesartan (AVAPRO) 150 MG tablet Take 1 tablet (150 mg total) by mouth daily. 90 tablet 0   NON FORMULARY Green Miracle  1 scoop a day     NON FORMULARY 5 billion CFU 6 strains  (vegan based probiotic)     potassium chloride (KLOR-CON) 8 MEQ tablet TAKE 2 TABLETS (16 MEQ TOTAL) BY MOUTH 2 (TWO) TIMES DAILY. 360 tablet 0   sulfamethoxazole-trimethoprim (BACTRIM DS) 800-160 MG tablet Take 1 tablet by mouth 2 (two) times daily. One PO BID x 3 days 6 tablet 0   terconazole (TERAZOL 3) 0.8 % vaginal cream Place 1 applicator vaginally at bedtime. Place nightly for 3 nights. 20 g 0   zinc gluconate 50 MG tablet Take 50 mg by mouth as needed.     No current facility-administered medications for this visit.    Family History  Problem Relation Age of Onset   Coronary artery disease Mother    Coronary artery disease Father    Prostate cancer Father    Diabetes Father        AOD   Colon cancer Neg Hx    Esophageal cancer Neg Hx    Rectal cancer Neg Hx    Stomach cancer Neg Hx     Review of Systems  Exam:   There were no vitals taken for this visit.    General appearance: alert, cooperative and appears stated age Head: normocephalic, without obvious abnormality, atraumatic Neck: no adenopathy,  supple, symmetrical, trachea midline and thyroid normal to inspection and palpation Lungs: clear to auscultation bilaterally Breasts: normal appearance, no masses or tenderness, No nipple retraction or dimpling, No nipple discharge or bleeding, No axillary adenopathy Heart: regular rate and rhythm Abdomen: soft, non-tender; no masses, no organomegaly Extremities: extremities normal, atraumatic, no cyanosis or edema Skin: skin color, texture, turgor normal. No rashes or lesions Lymph nodes: cervical, supraclavicular, and axillary nodes normal. Neurologic: grossly normal  Pelvic: External genitalia:  no lesions              No abnormal inguinal nodes palpated.              Urethra:  normal appearing urethra with no masses, tenderness or lesions              Bartholins and Skenes: normal                 Vagina: normal appearing vagina with normal color and discharge, no  lesions              Cervix: no lesions              Pap taken: {yes no:314532} Bimanual Exam:  Uterus:  normal size, contour, position, consistency, mobility, non-tender              Adnexa: no mass, fullness, tenderness              Rectal exam: {yes no:314532}.  Confirms.              Anus:  normal sphincter tone, no lesions  Chaperone was present for exam:  ***  Assessment:   Well woman visit with gynecologic exam.   Plan: Mammogram screening discussed. Self breast awareness reviewed. Pap and HR HPV as above. Guidelines for Calcium, Vitamin D, regular exercise program including cardiovascular and weight bearing exercise.   Follow up annually and prn.   Additional counseling given.  {yes [id number]. _______ minutes face to face time of which over 50% was spent in counseling.    After visit summary provided.

## 2023-05-18 ENCOUNTER — Telehealth: Payer: Self-pay | Admitting: Internal Medicine

## 2023-05-18 ENCOUNTER — Other Ambulatory Visit: Payer: Self-pay | Admitting: Internal Medicine

## 2023-05-18 DIAGNOSIS — F5104 Psychophysiologic insomnia: Secondary | ICD-10-CM

## 2023-05-18 MED ORDER — ESZOPICLONE 3 MG PO TABS: 3.0000 mg | ORAL_TABLET | Freq: Every day | ORAL | 0 refills | Status: DC

## 2023-05-18 NOTE — Telephone Encounter (Signed)
Prescription Request  05/18/2023  LOV: 12/22/2022  What is the name of the medication or equipment? eszopiclone 3 MG TABS   Have you contacted your pharmacy to request a refill? No   Which  [id number] [place], [place] - [address] [place] [address] Phone: [phone] Fax: [phone]  Patient notified that their request is being sent to the clinical staff for review and that they should receive a response within 2 business days.   Please advise at Mobile [phone] (mobile)

## 2023-05-26 ENCOUNTER — Ambulatory Visit: Payer: Medicare Other | Admitting: Obstetrics and Gynecology

## 2023-06-18 ENCOUNTER — Telehealth: Payer: Self-pay | Admitting: Pharmacy Technician

## 2023-06-18 ENCOUNTER — Ambulatory Visit (INDEPENDENT_AMBULATORY_CARE_PROVIDER_SITE_OTHER): Payer: Medicare Other | Admitting: Internal Medicine

## 2023-06-18 VITALS — BP 126/66 | HR 48 | Temp 98.4°F | Resp 16 | Ht 65.0 in | Wt 136.0 lb

## 2023-06-18 DIAGNOSIS — R001 Bradycardia, unspecified: Secondary | ICD-10-CM

## 2023-06-18 DIAGNOSIS — N1831 Chronic kidney disease, stage 3a: Secondary | ICD-10-CM

## 2023-06-18 DIAGNOSIS — M810 Age-related osteoporosis without current pathological fracture: Secondary | ICD-10-CM | POA: Diagnosis not present

## 2023-06-18 DIAGNOSIS — R7303 Prediabetes: Secondary | ICD-10-CM | POA: Insufficient documentation

## 2023-06-18 DIAGNOSIS — I10 Essential (primary) hypertension: Secondary | ICD-10-CM

## 2023-06-18 LAB — CBC WITH DIFFERENTIAL/PLATELET
Basophils Absolute: 0.1 10*3/uL (ref 0.0–0.1)
Basophils Relative: 1.2 % (ref 0.0–3.0)
Eosinophils Absolute: 0.1 10*3/uL (ref 0.0–0.7)
Eosinophils Relative: 1.5 % (ref 0.0–5.0)
HCT: 39.9 % (ref 36.0–46.0)
Hemoglobin: 13 g/dL (ref 12.0–15.0)
Lymphocytes Relative: 17.9 % (ref 12.0–46.0)
Lymphs Abs: 1.1 10*3/uL (ref 0.7–4.0)
MCHC: 32.6 g/dL (ref 30.0–36.0)
MCV: 90.5 fl (ref 78.0–100.0)
Monocytes Absolute: 0.5 10*3/uL (ref 0.1–1.0)
Monocytes Relative: 7.7 % (ref 3.0–12.0)
Neutro Abs: 4.3 10*3/uL (ref 1.4–7.7)
Neutrophils Relative %: 71.7 % (ref 43.0–77.0)
Platelets: 206 10*3/uL (ref 150.0–400.0)
RBC: 4.41 Mil/uL (ref 3.87–5.11)
RDW: 13.1 % (ref 11.5–15.5)
WBC: 6 10*3/uL (ref 4.0–10.5)

## 2023-06-18 LAB — BASIC METABOLIC PANEL
BUN: 24 mg/dL — ABNORMAL HIGH (ref 6–23)
CO2: 30 mEq/L (ref 19–32)
Calcium: 10.3 mg/dL (ref 8.4–10.5)
Chloride: 100 mEq/L (ref 96–112)
Creatinine, Ser: 0.82 mg/dL (ref 0.40–1.20)
GFR: 74.11 mL/min (ref >60.00)
Glucose, Bld: 99 mg/dL (ref 70–99)
Potassium: 4.4 mEq/L (ref 3.5–5.1)
Sodium: 138 mEq/L (ref 135–145)

## 2023-06-18 LAB — HEMOGLOBIN A1C: Hgb A1c MFr Bld: 6 % (ref 4.6–6.5)

## 2023-06-18 LAB — PHOSPHORUS: Phosphorus: 3.6 mg/dL (ref 2.3–4.6)

## 2023-06-18 LAB — TSH: TSH: 1.56 u[IU]/mL (ref 0.35–5.50)

## 2023-06-18 LAB — VITAMIN D 25 HYDROXY (VIT D DEFICIENCY, FRACTURES): VITD: 40.75 ng/mL (ref 30.00–100.00)

## 2023-06-18 NOTE — Patient Instructions (Signed)
Bradycardia, Adult Bradycardia is a slower-than-normal heartbeat. A normal resting heart rate for an adult ranges from 60 to 100 beats per minute. With bradycardia, the resting heart rate is less than 60 beats per minute. Bradycardia can prevent enough oxygen from reaching certain areas of your body when you are active. It can be serious if it keeps enough oxygen from reaching your brain and other parts of your body. Bradycardia is not a problem for everyone. For some healthy adults, a slow resting heart rate is normal. What are the causes? This condition may be caused by: A problem with the heart, including: A problem with the heart's electrical system, such as a heart block. With a heart block, electrical signals between the chambers of the heart are partially or completely blocked, so they are not able to work as they should. A problem with the heart's natural pacemaker (sinus node). Heart disease. A heart attack. Heart damage. Lyme disease. A heart infection. A heart condition that is present at birth (congenital heart defect). Certain medicines that treat heart conditions. Certain conditions, such as hypothyroidism and obstructive sleep apnea. Problems with the balance of chemicals and other substances, like potassium, in the blood. Trauma. Radiation therapy. What increases the risk? You are more likely to develop this condition if you: Are age 65 or older. Have high blood pressure (hypertension), high cholesterol (hyperlipidemia), or diabetes. Drink heavily, use tobacco or nicotine products, or use drugs. What are the signs or symptoms? Symptoms of this condition include: Light-headedness. Feeling faint or fainting. Fatigue and weakness. Trouble with activity or exercise. Shortness of breath. Chest pain (angina). Drowsiness. Confusion. Dizziness. How is this diagnosed? This condition may be diagnosed based on: Your symptoms. Your medical history. A physical exam. During  the exam, your health care provider will listen to your heartbeat and check your pulse. To confirm the diagnosis, your health care provider may order tests, such as: Blood tests. An electrocardiogram (ECG). This test records the heart's electrical activity. The test can show how fast your heart is beating and whether the heartbeat is steady. A test in which you wear a portable device (event recorder or Holter monitor) to record your heart's electrical activity while you go about your day. An exercise test. How is this treated? Treatment for this condition depends on the cause of the condition and how severe your symptoms are. Treatment may involve: Treatment of the underlying condition. Changing your medicines or how much medicine you take. Having a small, battery-operated device called a pacemaker implanted under the skin. When bradycardia occurs, this device can be used to increase your heart rate and help your heart beat in a regular rhythm. Follow these instructions at home: Lifestyle Manage any health conditions that contribute to bradycardia as told by your health care provider. Follow a heart-healthy diet. A nutrition specialist (dietitian) can help educate you about healthy food options and changes. Follow an exercise program that is approved by your health care provider. Maintain a healthy weight. Try to reduce or manage your stress, such as with yoga or meditation. If you need help reducing stress, ask your health care provider. Do not use any products that contain nicotine or tobacco. These products include cigarettes, chewing tobacco, and vaping devices, such as e-cigarettes. If you need help quitting, ask your health care provider. Do not use illegal drugs. Alcohol use If you drink alcohol: Limit how much you have to: 0-1 drink a day for women who are not pregnant. 0-2 drinks a day   for men. Know how much alcohol is in a drink. In the [place] one drink equals one 12 oz bottle of  beer (355 mL), one 5 oz glass of wine (148 mL), or one 1 oz glass of hard liquor (44 mL). General instructions Take over-the-counter and prescription medicines only as told by your health care provider. Keep all follow-up visits. This is important. How is this prevented? In some cases, bradycardia may be prevented by: Treating underlying medical problems. Stopping behaviors or medicines that can trigger the condition. Contact a health care provider if: You feel light-headed or dizzy. You almost faint. You feel weak or are easily fatigued during physical activity. You experience confusion or have memory problems. Get help right away if: You faint. You have chest pains or an irregular heartbeat (palpitations). You have trouble breathing. These symptoms may represent a serious problem that is an emergency. Do not wait to see if the symptoms will go away. Get medical help right away. Call your local emergency services (911 in the [place] Do not drive yourself to the hospital. Summary Bradycardia is a slower-than-normal heartbeat. With bradycardia, the resting heart rate is less than 60 beats per minute. Treatment for this condition depends on the cause. Manage any health conditions that contribute to bradycardia as told by your health care provider. Do not use any products that contain nicotine or tobacco. These products include cigarettes, chewing tobacco, and vaping devices, such as e-cigarettes. Keep all follow-up visits. This is important. This information is not intended to replace advice given to you by your health care provider. Make sure you discuss any questions you have with your health care provider. Document Revised: 03/10/2021 Document Reviewed: 03/10/2021 Elsevier Patient Education  2024 Elsevier Inc.  

## 2023-06-18 NOTE — Telephone Encounter (Signed)
-----   Message from Sanda Linger sent at 06/18/2023 10:24 AM EDT ----- Regarding: prolia Will see if she can get prolia?

## 2023-06-18 NOTE — Progress Notes (Unsigned)
Subjective:  Patient ID: Yvette Walton, female    DOB: October 10, 1956  Age: 67 y.o. MRN: [id number]  CC: Hypertension   HPI Yvette Iles presents for f/up ---  Discussed the use of AI scribe software for clinical note transcription with the patient, who gave verbal consent to proceed.  History of Present Illness   The patient, with a known history of bradycardia, presents for a routine follow-up. She reports no symptoms related to her low heart rate, such as dizziness, lightheadedness, chest pain, or shortness of breath. The patient has been aware of her low heart rate for several years, but it has not caused any significant issues or limitations in her daily activities.  In addition to her cardiac history, the patient has been on Fosamax for osteoporosis. She expresses concern about the effectiveness of the medication. The patient also mentions a history of low vitamin D levels and is currently taking over-the-counter vitamin D supplementation daily. She expresses interest in knowing her current vitamin D level, as she understands its importance in bone health.       Outpatient Medications Prior to Visit  Medication Sig Dispense Refill   Cephalexin 250 MG tablet as needed.     clobetasol ointment (TEMOVATE) 0.05 % SMARTSIG:Topical 2-3 Times Weekly     estradiol (ESTRACE) 0.1 MG/GM vaginal cream Place 1 g vaginally 3 (three) times a week. 42.5 g 4   eszopiclone 3 MG TABS Take 1 tablet (3 mg total) by mouth at bedtime. Take immediately before bedtime 90 tablet 0   indapamide (LOZOL) 1.25 MG tablet TAKE 1 TABLET BY MOUTH EVERY DAY 90 tablet 1   irbesartan (AVAPRO) 150 MG tablet Take 1 tablet (150 mg total) by mouth daily. 90 tablet 0   NON FORMULARY Green Miracle  1 scoop a day     NON FORMULARY 5 billion CFU 6 strains (vegan based probiotic)     potassium chloride (KLOR-CON) 8 MEQ tablet TAKE 2 TABLETS (16 MEQ TOTAL) BY MOUTH 2 (TWO) TIMES DAILY. 360 tablet 0   sulfamethoxazole-trimethoprim  (BACTRIM DS) 800-160 MG tablet Take 1 tablet by mouth 2 (two) times daily. One PO BID x 3 days 6 tablet 0   terconazole (TERAZOL 3) 0.8 % vaginal cream Place 1 applicator vaginally at bedtime. Place nightly for 3 nights. 20 g 0   zinc gluconate 50 MG tablet Take 50 mg by mouth as needed.     alendronate (FOSAMAX) 70 MG tablet TAKE 1 TABLET (70 MG TOTAL) BY MOUTH EVERY 7 DAYS WITH FULL GLASS WATER ON EMPTY STOMACH 12 tablet 0   No facility-administered medications prior to visit.    ROS Review of Systems  Objective:  BP 126/66 (BP Location: Left Arm, Patient Position: Sitting, Cuff Size: Normal)   Pulse (!) 48   Temp 98.4 F (36.9 C) (Oral)   Ht 5\' 5"  (1.651 m)   Wt 136 lb (61.7 kg)   SpO2 98%   BMI 22.63 kg/m   BP Readings from Last 3 Encounters:  06/18/23 126/66  04/07/23 124/78  03/18/23 124/72    Wt Readings from Last 3 Encounters:  06/18/23 136 lb (61.7 kg)  04/13/23 137 lb (62.1 kg)  04/07/23 137 lb (62.1 kg)    Physical Exam Vitals reviewed.  Constitutional:      Appearance: Normal appearance.  HENT:     Nose: Nose normal.     Mouth/Throat:     Mouth: Mucous membranes are moist.  Eyes:     General:  No scleral icterus.    Conjunctiva/sclera: Conjunctivae normal.  Cardiovascular:     Rate and Rhythm: Regular rhythm. Bradycardia present.     Heart sounds: No murmur heard.    No friction rub. No gallop.     Comments: EKG- SB, 45 bpm Low voltage Septal infarct pattern - slightly more prominent No LVH or acute ST/T wave changes Pulmonary:     Effort: Pulmonary effort is normal.     Breath sounds: No stridor. No wheezing, rhonchi or rales.  Abdominal:     General: Abdomen is flat.     Palpations: There is no mass.     Tenderness: There is no abdominal tenderness. There is no guarding.     Hernia: No hernia is present.  Musculoskeletal:        General: Normal range of motion.     Cervical back: Neck supple.     Right lower leg: No edema.     Left lower  leg: No edema.  Lymphadenopathy:     Cervical: No cervical adenopathy.  Skin:    General: Skin is warm and dry.  Neurological:     General: No focal deficit present.     Mental Status: She is alert. Mental status is at baseline.  Psychiatric:        Mood and Affect: Mood normal.        Behavior: Behavior normal.     Lab Results  Component Value Date   WBC 6.0 06/18/2023   HGB 13.0 06/18/2023   HCT 39.9 06/18/2023   PLT 206.0 06/18/2023   GLUCOSE 99 06/18/2023   CHOL 179 12/18/2021   TRIG 106.0 12/18/2021   HDL 59.00 12/18/2021   LDLCALC 99 12/18/2021   ALT 14 12/22/2022   AST 15 12/22/2022   NA 138 06/18/2023   K 4.4 06/18/2023   CL 100 06/18/2023   CREATININE 0.82 06/18/2023   BUN 24 (H) 06/18/2023   CO2 30 06/18/2023   TSH 1.56 06/18/2023   HGBA1C 6.0 06/18/2023    MM 3D SCREENING MAMMOGRAM BILATERAL BREAST  Result Date: 02/23/2023 CLINICAL DATA:  Screening. EXAM: DIGITAL SCREENING BILATERAL MAMMOGRAM WITH TOMOSYNTHESIS AND CAD TECHNIQUE: Bilateral screening digital craniocaudal and mediolateral oblique mammograms were obtained. Bilateral screening digital breast tomosynthesis was performed. The images were evaluated with computer-aided detection. COMPARISON:  Previous exam(s). ACR Breast Density Category b: There are scattered areas of fibroglandular density. FINDINGS: There are no findings suspicious for malignancy. IMPRESSION: No mammographic evidence of malignancy. A result letter of this screening mammogram will be mailed directly to the patient. RECOMMENDATION: Screening mammogram in one year. (Code:SM-B-01Y) BI-RADS CATEGORY  1: Negative. Electronically Signed   By: Sherian Rein M.D.   On: 02/23/2023 15:28    Assessment & Plan:  Essential hypertension -     TSH; Future -     CBC with Differential/Platelet; Future  Osteoporosis, senile -     VITAMIN D 25 Hydroxy (Vit-D Deficiency, Fractures); Future -     Phosphorus; Future  Bradycardia, sinus -     TSH;  Future -     EKG 12-Lead  Prediabetes -     Hemoglobin A1c; Future -     Basic metabolic panel; Future  Stage 3a chronic kidney disease (HCC)     Follow-up: Return in about 6 months (around 12/19/2023).  Sanda Linger, MD

## 2023-06-19 ENCOUNTER — Other Ambulatory Visit (HOSPITAL_COMMUNITY): Payer: Self-pay

## 2023-06-19 ENCOUNTER — Encounter: Payer: Self-pay | Admitting: Internal Medicine

## 2023-06-19 NOTE — Telephone Encounter (Signed)
[employer] requires failure of both an oral and intravenous bisphosphate before medical coverage of Prolia.       Ran test claim for Prolia and the current 180 day co-pay is $300. Patient can get Prolia through pharmacy benefits.

## 2023-06-20 ENCOUNTER — Other Ambulatory Visit: Payer: Self-pay | Admitting: Internal Medicine

## 2023-06-20 DIAGNOSIS — I10 Essential (primary) hypertension: Secondary | ICD-10-CM

## 2023-06-20 MED ORDER — IRBESARTAN 150 MG PO TABS
150.0000 mg | ORAL_TABLET | Freq: Every day | ORAL | 0 refills | Status: DC
Start: 2023-06-20 — End: 2023-09-15

## 2023-07-03 ENCOUNTER — Other Ambulatory Visit: Payer: Self-pay | Admitting: Obstetrics and Gynecology

## 2023-07-16 ENCOUNTER — Telehealth: Payer: Self-pay | Admitting: Internal Medicine

## 2023-07-16 ENCOUNTER — Other Ambulatory Visit: Payer: Self-pay | Admitting: Internal Medicine

## 2023-07-16 DIAGNOSIS — M810 Age-related osteoporosis without current pathological fracture: Secondary | ICD-10-CM

## 2023-07-16 NOTE — Telephone Encounter (Signed)
Prolia was denied I will order a referral

## 2023-07-16 NOTE — Telephone Encounter (Signed)
Patient called and said Dr. Yetta Barre wanted to switch her to prolia injections, but hasn't heard anything back since. She is currently taking Fosamax and she said she has two weeks worth left. She didn't know if it was still going to be changed to prolia or if she needs to request a refill of the Fosamax. Patient would like a call back at [phone].

## 2023-07-17 NOTE — Telephone Encounter (Signed)
Pt has been informed that referral was entered.   She stated that she received a call from [facility] yesterday but did not schedule at that time but she would call them back today to get scheduled.

## 2023-07-21 ENCOUNTER — Encounter: Payer: Self-pay | Admitting: Internal Medicine

## 2023-07-22 ENCOUNTER — Encounter: Payer: Self-pay | Admitting: Internal Medicine

## 2023-07-23 ENCOUNTER — Other Ambulatory Visit: Payer: Self-pay

## 2023-07-23 DIAGNOSIS — M810 Age-related osteoporosis without current pathological fracture: Secondary | ICD-10-CM

## 2023-07-23 MED ORDER — ALENDRONATE SODIUM 70 MG PO TABS: 70.0000 mg | ORAL_TABLET | ORAL | 0 refills | Status: DC

## 2023-07-23 NOTE — Telephone Encounter (Signed)
Pt LVM in triage line requesting refill on alendronate rx.   This rx is not on pt's current med list; Rx was discontinued by pt's PCP on 06/18/2023.  Spoke w/ pt and she confirmed that rx must have been discontinued by PCP because she was agreeable to try a different form of tx (prolia) at time of appt on 06/18/2023. However, when she was made aware that she would have a $300.00 copay. So, she declined the new tx for now due to the costs and wished to continue on fosamax until DEXA scan was updated.   Med refill request: alendronate 70mg  Last AEX: 02/24/2022 Next AEX: B&P recall placed for 01/2024 (Pt is LR MCR), scheduled for 71yr med check on 10/01/2023 Last DEXA: 08/07/2022 osteoporosis (T-score -2.8) Refill authorized: Rx pend.

## 2023-07-30 ENCOUNTER — Other Ambulatory Visit: Payer: Self-pay | Admitting: Internal Medicine

## 2023-07-30 DIAGNOSIS — I10 Essential (primary) hypertension: Secondary | ICD-10-CM

## 2023-08-03 ENCOUNTER — Other Ambulatory Visit: Payer: Self-pay | Admitting: Internal Medicine

## 2023-08-03 DIAGNOSIS — E876 Hypokalemia: Secondary | ICD-10-CM

## 2023-08-03 DIAGNOSIS — I10 Essential (primary) hypertension: Secondary | ICD-10-CM

## 2023-08-04 ENCOUNTER — Other Ambulatory Visit: Payer: Self-pay | Admitting: Internal Medicine

## 2023-08-24 ENCOUNTER — Other Ambulatory Visit: Payer: Self-pay | Admitting: Internal Medicine

## 2023-08-24 ENCOUNTER — Telehealth: Payer: Self-pay | Admitting: Internal Medicine

## 2023-08-24 DIAGNOSIS — F5104 Psychophysiologic insomnia: Secondary | ICD-10-CM

## 2023-08-24 MED ORDER — ESZOPICLONE 3 MG PO TABS
3.0000 mg | ORAL_TABLET | Freq: Every day | ORAL | 0 refills | Status: AC
Start: 2023-08-24 — End: ?

## 2023-08-24 NOTE — Telephone Encounter (Signed)
Patient said she has one pill left that she will take tonight.   Prescription Request  08/24/2023  LOV: 06/18/2023  What is the name of the medication or equipment? eszopiclone 3 MG TABS   Have you contacted your pharmacy to request a refill? No   Which pharmacy would you like this sent to?  [id number] [place], [place] - [address] [place] [address] Phone: [phone] Fax: [phone]    Patient notified that their request is being sent to the clinical staff for review and that they should receive a response within 2 business days.   Please advise at Mobile [phone] (mobile)

## 2023-08-26 DIAGNOSIS — N3020 Other chronic cystitis without hematuria: Secondary | ICD-10-CM | POA: Diagnosis not present

## 2023-09-02 DIAGNOSIS — N3020 Other chronic cystitis without hematuria: Secondary | ICD-10-CM | POA: Diagnosis not present

## 2023-09-15 ENCOUNTER — Other Ambulatory Visit: Payer: Self-pay | Admitting: Internal Medicine

## 2023-09-15 DIAGNOSIS — I10 Essential (primary) hypertension: Secondary | ICD-10-CM

## 2023-09-17 NOTE — Progress Notes (Signed)
67 y.o. G0P0000 Single Caucasian female here for office visit.  She is followed for vaginal atrophy, hx UTIs, and osteoporosis.   Taking Fosamax and tolerating it well.   Joined [employer].   Taking Probiotic and is doing well.   Using Clobetasol as needed for vulvar irritation.  She tends to use it twice a week.   Reducing her carbohydrate and sugar intake.   Enjoying travel through [employer].   PCP: Etta Grandchild, MD   No LMP recorded. Patient is postmenopausal.           Sexually active: Yes.    The current method of family planning is post menopausal status.    Exercising: Yes.    [employer], [employer], walking.  Smoker:  no  OB History  Gravida Para Term Preterm AB Living  0 0 0 0 0 0  SAB IAB Ectopic Multiple Live Births  0 0 0 0 0     Health Maintenance: Pap:  02/24/22 neg, 07/18/19 neg: HR HPV neg History of abnormal Pap:  no MMG: 02/20/23 Breast Density Cat B, BI-RADS CAT 1 neg Colonoscopy:   HM Colonoscopy          Colonoscopy (Every 10 Years) Due soon on 10/25/2023    10/24/2013  COLONOSCOPY   Only the first 1 history entries have been loaded, but more history exists.           BMD:  08/27/22  Result osteoporosis of spine and right hip, osteopenia of left hip. HIV: 07/07/19 NR Hep C: 07/07/19 NR  Immunization History  Administered Date(s) Administered   Fluad Quad(high Dose 65+) 08/18/2022, 08/18/2022   H1N1 01/01/2009   Influenza Split 08/31/2012, 09/22/2019, 08/27/2020   Influenza Whole 08/31/2010   Influenza, High Dose Seasonal PF 09/17/2022, 07/24/2023   Influenza,inj,Quad PF,6+ Mos 11/04/2018, 09/22/2019   Influenza-Unspecified 09/21/2021   Moderna Covid-19 Fall Seasonal Vaccine 33yrs & older 09/17/2022   PFIZER Comirnaty(Gray Top)Covid-19 Tri-Sucrose Vaccine 08/18/2022   PFIZER(Purple Top)SARS-COV-2 Vaccination 03/11/2020, 04/02/2020, 11/11/2020   PNEUMOCOCCAL CONJUGATE-20 06/12/2021   Pfizer Covid-19 Vaccine Bivalent  Booster 47yrs & up 12/19/2021, 08/18/2022   Pneumococcal Polysaccharide-23 12/01/2005, 03/22/2014   Respiratory Syncytial Virus Vaccine,Recomb Aduvanted(Arexvy) 09/09/2022   Tdap 05/14/2006, 08/26/2017   Zoster Recombinant(Shingrix) 12/02/2018, 04/26/2019       reports that she has never smoked. She has been exposed to tobacco smoke. She has never used smokeless tobacco. She reports current alcohol use of about 2.0 standard drinks of alcohol per week. She reports that she does not use drugs.  Past Medical History:  Diagnosis Date   Allergic rhinitis    Asthma    Bradycardia    Elevated hemoglobin A1c 12/23/2022   6.1   Hypertension    Osteoporosis 2023   spine and hip    Past Surgical History:  Procedure Laterality Date   COLONOSCOPY  2014   NASAL SEPTUM SURGERY     TONSILLECTOMY      Current Outpatient Medications  Medication Sig Dispense Refill   alendronate (FOSAMAX) 70 MG tablet Take 1 tablet (70 mg total) by mouth every 7 (seven) days. Take with a full glass of water on an empty stomach. 12 tablet 0   Cholecalciferol (VITAMIN D3) 125 MCG (5000 UT) CAPS Take 1 capsule by mouth daily.     clobetasol ointment (TEMOVATE) 0.05 % SMARTSIG:Topical 2-3 Times Weekly     estradiol (ESTRACE) 0.1 MG/GM vaginal cream Place 1 g vaginally 3 (three) times a week. 42.5 g 4  indapamide (LOZOL) 1.25 MG tablet TAKE 1 TABLET BY MOUTH EVERY DAY 90 tablet 1   irbesartan (AVAPRO) 150 MG tablet TAKE 1 TABLET BY MOUTH EVERY DAY 90 tablet 0   Na Sulfate-K Sulfate-Mg Sulf 17.5-3.13-1.6 GM/177ML SOLN See admin instructions.     NON FORMULARY Green Miracle  1 scoop a day     NON FORMULARY Pro - Women with D- Mannose and Cranberry (Probiotic)     potassium chloride (KLOR-CON) 8 MEQ tablet TAKE 2 TABLETS (16 MEQ TOTAL) BY MOUTH 2 (TWO) TIMES DAILY. 360 tablet 0   terconazole (TERAZOL 3) 0.8 % vaginal cream Place 1 applicator vaginally at bedtime. Place nightly for 3 nights. 20 g 0   Cephalexin 250 MG  tablet as needed. (Patient not taking: Reported on 09/18/2023)     eszopiclone 3 MG TABS Take 1 tablet (3 mg total) by mouth at bedtime. Take immediately before bedtime (Patient not taking: Reported on 09/18/2023) 90 tablet 0   fluconazole (DIFLUCAN) 150 MG tablet Take 150 mg by mouth daily as needed (Yeast infections). (Patient not taking: Reported on 09/18/2023)     No current facility-administered medications for this visit.    Family History  Problem Relation Age of Onset   Coronary artery disease Mother    Coronary artery disease Father    Prostate cancer Father    Diabetes Father        AOD   Colon cancer Neg Hx    Esophageal cancer Neg Hx    Rectal cancer Neg Hx    Stomach cancer Neg Hx     Review of Systems  All other systems reviewed and are negative.   Exam:   BP 116/70 (BP Location: Left Arm, Patient Position: Sitting, Cuff Size: Normal)   Ht 5' 6.25" (1.683 m)   Wt 138 lb (62.6 kg)   BMI 22.11 kg/m     General appearance: alert, cooperative and appears stated age Head: normocephalic, without obvious abnormality, atraumatic Neck: no adenopathy, supple, symmetrical, trachea midline and thyroid normal to inspection and palpation Lungs: clear to auscultation bilaterally Breasts: normal appearance, no masses or tenderness, No nipple retraction or dimpling, No nipple discharge or bleeding, No axillary adenopathy Heart: regular rate and rhythm Abdomen: soft, non-tender; no masses, no organomegaly Extremities: extremities normal, atraumatic, no cyanosis or edema Skin: skin color, texture, turgor normal. No rashes or lesions Lymph nodes: cervical, supraclavicular, and axillary nodes normal. Neurologic: grossly normal  Pelvic: External genitalia:  no lesions              No abnormal inguinal nodes palpated.              Urethra:  normal appearing urethra with no masses, tenderness or lesions              Bartholins and Skenes: normal                 Vagina: normal  appearing vagina with normal color and discharge, no lesions              Cervix: no lesions               Bimanual Exam:  Uterus:  normal size, contour, position, consistency, mobility, non-tender              Adnexa: no mass, fullness, tenderness         Chaperone was present for exam:  Warren Lacy, CMA   Assessment:  Vaginal atrophy.  Hx UTI.  Chronic vulvitis.  Osteoporosis.   Plan: Mammogram screening discussed. Self breast awareness reviewed. Guidelines for Calcium, Vitamin D, regular exercise program including cardiovascular and weight bearing exercise. Refill of estradiol cream, Fosamax, and Clobetasol ointment.  I discussed potential effect of estradiol cream on breast cancer.  BMD in 2026.  Fu in one year for breast and pelvic exam and follow up.   35 min  total time was spent for this patient encounter, including preparation, face-to-face counseling with the patient, coordination of care, and documentation of the encounter.

## 2023-09-18 ENCOUNTER — Ambulatory Visit (AMBULATORY_SURGERY_CENTER): Payer: Medicare Other

## 2023-09-18 DIAGNOSIS — Z1211 Encounter for screening for malignant neoplasm of colon: Secondary | ICD-10-CM

## 2023-09-18 MED ORDER — NA SULFATE-K SULFATE-MG SULF 17.5-3.13-1.6 GM/177ML PO SOLN
1.0000 | Freq: Once | ORAL | 0 refills | Status: AC
Start: 2023-09-18 — End: 2023-09-18

## 2023-09-18 NOTE — Progress Notes (Signed)
No egg or soy allergy known to patient  No issues known to pt with past sedation with any surgeries or procedures Patient denies ever being told they had issues or difficulty with intubation  No FH of Malignant Hyperthermia Pt is not on diet pills Pt is not on  home 02  Pt is not on blood thinners  Pt denies issues with constipation  No A fib or A flutter. Pt reports bradycardia.  Have any cardiac testing pending--no  LOA: independent  Prep: suprep   Patient's chart reviewed by Cathlyn Parsons CNRA prior to previsit and patient appropriate for the [facility].  Previsit completed and red dot placed by patient's name on their procedure day (on provider's schedule).     PV competed with patient. Prep instructions sent via mychart and home address. Goodrx coupon for [facility] provided to use for price reduction if needed.

## 2023-09-23 ENCOUNTER — Encounter: Payer: Self-pay | Admitting: Internal Medicine

## 2023-10-01 ENCOUNTER — Ambulatory Visit: Payer: Medicare Other | Admitting: Obstetrics and Gynecology

## 2023-10-01 ENCOUNTER — Encounter: Payer: Self-pay | Admitting: Obstetrics and Gynecology

## 2023-10-01 VITALS — BP 116/70 | Ht 66.25 in | Wt 138.0 lb

## 2023-10-01 DIAGNOSIS — N763 Subacute and chronic vulvitis: Secondary | ICD-10-CM

## 2023-10-01 DIAGNOSIS — N952 Postmenopausal atrophic vaginitis: Secondary | ICD-10-CM

## 2023-10-01 DIAGNOSIS — M810 Age-related osteoporosis without current pathological fracture: Secondary | ICD-10-CM

## 2023-10-01 MED ORDER — ALENDRONATE SODIUM 70 MG PO TABS
70.0000 mg | ORAL_TABLET | ORAL | 3 refills | Status: DC
Start: 2023-10-01 — End: 2024-08-25

## 2023-10-01 MED ORDER — CLOBETASOL PROPIONATE 0.05 % EX OINT: TOPICAL_OINTMENT | CUTANEOUS | 1 refills | Status: DC

## 2023-10-01 MED ORDER — ESTRADIOL 0.1 MG/GM VA CREA
1.0000 g | TOPICAL_CREAM | VAGINAL | 2 refills | Status: DC
Start: 2023-10-02 — End: 2023-11-05

## 2023-10-01 NOTE — Patient Instructions (Addendum)
EXERCISE AND DIET:  We recommended that you start or continue a regular exercise program for good health. Regular exercise means any activity that makes your heart beat faster and makes you sweat.  We recommend exercising at least 30 minutes per day at least 3 days a week, preferably 4 or 5.  We also recommend a diet low in fat and sugar.  Inactivity, poor dietary choices and obesity can cause diabetes, heart attack, stroke, and kidney damage, among others.    ALCOHOL AND SMOKING:  Women should limit their alcohol intake to no more than 7 drinks/beers/glasses of wine (combined, not each!) per week. Moderation of alcohol intake to this level decreases your risk of breast cancer and liver damage. And of course, no recreational drugs are part of a healthy lifestyle.  And absolutely no smoking or even second hand smoke. Most people know smoking can cause heart and lung diseases, but did you know it also contributes to weakening of your bones? Aging of your skin?  Yellowing of your teeth and nails?  CALCIUM AND VITAMIN D:  Adequate intake of calcium and Vitamin D are recommended.  The recommendations for exact amounts of these supplements seem to change often, but generally speaking 600 mg of calcium (either carbonate or citrate) and 800 units of Vitamin D per day seems prudent. Certain women may benefit from higher intake of Vitamin D.  If you are among these women, your doctor will have told you during your visit.    PAP SMEARS:  Pap smears, to check for cervical cancer or precancers,  have traditionally been done yearly, although recent scientific advances have shown that most women can have pap smears less often.  However, every woman still should have a physical exam from her gynecologist every year. It will include a breast check, inspection of the vulva and vagina to check for abnormal growths or skin changes, a visual exam of the cervix, and then an exam to evaluate the size and shape of the uterus and  ovaries.  And after 67 years of age, a rectal exam is indicated to check for rectal cancers. We will also provide age appropriate advice regarding health maintenance, like when you should have certain vaccines, screening for sexually transmitted diseases, bone density testing, colonoscopy, mammograms, etc.   MAMMOGRAMS:  All women over 40 years old should have a yearly mammogram. Many facilities now offer a "3D" mammogram, which may cost around $50 extra out of pocket. If possible,  we recommend you accept the option to have the 3D mammogram performed.  It both reduces the number of women who will be called back for extra views which then turn out to be normal, and it is better than the routine mammogram at detecting truly abnormal areas.    COLONOSCOPY:  Colonoscopy to screen for colon cancer is recommended for all women at age 50.  We know, you hate the idea of the prep.  We agree, BUT, having colon cancer and not knowing it is worse!!  Colon cancer so often starts as a polyp that can be seen and removed at colonscopy, which can quite literally save your life!  And if your first colonoscopy is normal and you have no family history of colon cancer, most women don't have to have it again for 10 years.  Once every ten years, you can do something that may end up saving your life, right?  We will be happy to help you get it scheduled when you are ready.    Be sure to check your insurance coverage so you understand how much it will cost.  It may be covered as a preventative service at no cost, but you should check your particular policy.    Calcium Content in Foods Calcium is the most abundant mineral in the body. Most of the body's calcium supply is stored in bones and teeth. Calcium helps many parts of the body function normally, including: Blood and blood vessels. Nerves. Hormones. Muscles. Bones and teeth. When your calcium stores are low, you may be at risk for low bone mass, bone loss, and broken bones  (fractures). When you get enough calcium, it helps to support strong bones and teeth throughout your life. Calcium is especially important for: Children during growth spurts. Girls during adolescence. Women who are pregnant or breastfeeding. Women after their menstrual cycle stops (postmenopause). Women whose menstrual cycle has stopped due to anorexia nervosa or regular intense exercise. People who cannot eat or digest dairy products. Vegans. Recommended daily amounts of calcium: Women (ages 19 to 50): 1,000 mg per day. Women (ages 51 and older): 1,200 mg per day. Men (ages 19 to 70): 1,000 mg per day. Men (ages 71 and older): 1,200 mg per day. Women (ages 9 to 18): 1,300 mg per day. Men (ages 9 to 18): 1,300 mg per day. General information Eat foods that are high in calcium. Try to get most of your calcium from food. Some people may benefit from taking calcium supplements. Check with your health care provider or diet and nutrition specialist (dietitian) before starting any calcium supplements. Calcium supplements may interact with certain medicines. Too much calcium may cause other health problems, such as constipation and kidney stones. For the body to absorb calcium, it needs vitamin D. Sources of vitamin D include: Skin exposure to direct sunlight. Foods, such as egg yolks, liver, mushrooms, saltwater fish, and fortified milk. Vitamin D supplements. Check with your health care provider or dietitian before starting any vitamin D supplements. What foods are high in calcium?  Foods that are high in calcium contain more than 100 milligrams per serving. Fruits Fortified orange juice or other fruit juice, 300 mg per 8 oz serving. Vegetables Collard greens, 360 mg per 8 oz serving. Kale, 100 mg per 8 oz serving. Bok choy, 160 mg per 8 oz serving. Grains Fortified ready-to-eat cereals, 100 to 1,000 mg per 8 oz serving. Fortified frozen waffles, 200 mg in 2 waffles. Oatmeal, 140 mg in  1 cup. Meats and other proteins Sardines, canned with bones, 325 mg per 3 oz serving. Salmon, canned with bones, 180 mg per 3 oz serving. Canned shrimp, 125 mg per 3 oz serving. Baked beans, 160 mg per 4 oz serving. Tofu, firm, made with calcium sulfate, 253 mg per 4 oz serving. Dairy Yogurt, plain, low-fat, 310 mg per 6 oz serving. Nonfat milk, 300 mg per 8 oz serving. American cheese, 195 mg per 1 oz serving. Cheddar cheese, 205 mg per 1 oz serving. Cottage cheese 2%, 105 mg per 4 oz serving. Fortified soy, rice, or almond milk, 300 mg per 8 oz serving. Mozzarella, part skim, 210 mg per 1 oz serving. The items listed above may not be a complete list of foods high in calcium. Actual amounts of calcium may be different depending on processing. Contact a dietitian for more information. What foods are lower in calcium? Foods that are lower in calcium contain 50 mg or less per serving. Fruits Apple, about 6 mg. Banana, about 12 mg.   Vegetables Lettuce, 19 mg per 2 oz serving. Tomato, about 11 mg. Grains Rice, 4 mg per 6 oz serving. Boiled potatoes, 14 mg per 8 oz serving. White bread, 6 mg per slice. Meats and other proteins Egg, 27 mg per 2 oz serving. Red meat, 7 mg per 4 oz serving. Chicken, 17 mg per 4 oz serving. Fish, cod, or trout, 20 mg per 4 oz serving. Dairy Cream cheese, regular, 14 mg per 1 Tbsp serving. Brie cheese, 50 mg per 1 oz serving. Parmesan cheese, 70 mg per 1 Tbsp serving. The items listed above may not be a complete list of foods lower in calcium. Actual amounts of calcium may be different depending on processing. Contact a dietitian for more information. Summary Calcium is an important mineral in the body because it affects many functions. Getting enough calcium helps support strong bones and teeth throughout your life. Try to get most of your calcium from food. Calcium supplements may interact with certain medicines. Check with your health care provider  or dietitian before starting any calcium supplements. This information is not intended to replace advice given to you by your health care provider. Make sure you discuss any questions you have with your health care provider. Document Revised: 03/14/2020 Document Reviewed: 03/14/2020 Elsevier Patient Education  2024 [employer] Inc.  

## 2023-10-05 ENCOUNTER — Other Ambulatory Visit: Payer: Self-pay | Admitting: Internal Medicine

## 2023-10-05 DIAGNOSIS — Z1231 Encounter for screening mammogram for malignant neoplasm of breast: Secondary | ICD-10-CM

## 2023-10-06 ENCOUNTER — Encounter: Payer: Self-pay | Admitting: Internal Medicine

## 2023-10-06 ENCOUNTER — Ambulatory Visit: Payer: Medicare Other | Admitting: Internal Medicine

## 2023-10-06 VITALS — BP 99/53 | HR 41 | Temp 97.4°F | Resp 11 | Ht 65.0 in | Wt 137.0 lb

## 2023-10-06 DIAGNOSIS — D122 Benign neoplasm of ascending colon: Secondary | ICD-10-CM

## 2023-10-06 DIAGNOSIS — Z1211 Encounter for screening for malignant neoplasm of colon: Secondary | ICD-10-CM

## 2023-10-06 MED ORDER — SODIUM CHLORIDE 0.9 % IV SOLN: 500.0000 mL | Freq: Once | INTRAVENOUS | Status: DC

## 2023-10-06 NOTE — Progress Notes (Signed)
HISTORY OF PRESENT ILLNESS:  Yvette Walton is a 67 y.o. female who presents today for repeat screening colonoscopy.  Previous exam 2014 with diverticulosis.  No complaints  REVIEW OF SYSTEMS:  All non-GI ROS negative except for  Past Medical History:  Diagnosis Date   Allergic rhinitis    Asthma    Bradycardia    Elevated hemoglobin A1c 12/23/2022   6.1   Hypertension    Osteoporosis 2023   spine and hip    Past Surgical History:  Procedure Laterality Date   COLONOSCOPY  2014   NASAL SEPTUM SURGERY     TONSILLECTOMY      Social History Yvette Gautreau  reports that she has never smoked. She has been exposed to tobacco smoke. She has never used smokeless tobacco. She reports current alcohol use of about 2.0 standard drinks of alcohol per week. She reports that she does not use drugs.  family history includes Coronary artery disease in her father and mother; Diabetes in her father; Prostate cancer in her father.  No Known Allergies     PHYSICAL EXAMINATION: Vital signs: BP 132/71   Pulse (!) 51   Temp (!) 97.4 F (36.3 C) (Skin)   Ht 5\' 5"  (1.651 m)   Wt 137 lb (62.1 kg)   SpO2 97%   BMI 22.80 kg/m  General: Well-developed, well-nourished, no acute distress HEENT: Sclerae are anicteric, conjunctiva pink. Oral mucosa intact Lungs: Clear Heart: Regular Abdomen: soft, nontender, nondistended, no obvious ascites, no peritoneal signs, normal bowel sounds. No organomegaly. Extremities: No edema Psychiatric: alert and oriented x3. Cooperative     ASSESSMENT:  Colon cancer screening   PLAN:  Screening colonoscopy

## 2023-10-06 NOTE — Patient Instructions (Signed)
Resume previous diet and medications. Awaiting pathology results. Repeat Colonoscopy in 7 years for surveillance purposes. Handouts provided on Colon polyps, Diverticulosis and Hemorrhoids  YOU HAD AN ENDOSCOPIC PROCEDURE TODAY AT THE [facility]:   Refer to the procedure report that was given to you for any specific questions about what was found during the examination.  If the procedure report does not answer your questions, please call your gastroenterologist to clarify.  If you requested that your care partner not be given the details of your procedure findings, then the procedure report has been included in a sealed envelope for you to review at your convenience later.  YOU SHOULD EXPECT: Some feelings of bloating in the abdomen. Passage of more gas than usual.  Walking can help get rid of the air that was put into your GI tract during the procedure and reduce the bloating. If you had a lower endoscopy (such as a colonoscopy or flexible sigmoidoscopy) you may notice spotting of blood in your stool or on the toilet paper. If you underwent a bowel prep for your procedure, you may not have a normal bowel movement for a few days.  Please Note:  You might notice some irritation and congestion in your nose or some drainage.  This is from the oxygen used during your procedure.  There is no need for concern and it should clear up in a day or so.  SYMPTOMS TO REPORT IMMEDIATELY:  Following lower endoscopy (colonoscopy or flexible sigmoidoscopy):  Excessive amounts of blood in the stool  Significant tenderness or worsening of abdominal pains  Swelling of the abdomen that is new, acute  Fever of 100F or higher  For urgent or emergent issues, a gastroenterologist can be reached at any hour by calling [phone]. Do not use MyChart messaging for urgent concerns.    DIET:  We do recommend a small meal at first, but then you may proceed to your regular diet.  Drink plenty of fluids but  you should avoid alcoholic beverages for 24 hours.  ACTIVITY:  You should plan to take it easy for the rest of today and you should NOT DRIVE or use heavy machinery until tomorrow (because of the sedation medicines used during the test).    FOLLOW UP: Our staff will call the number listed on your records the next business day following your procedure.  We will call around 7:15- 8:00 am to check on you and address any questions or concerns that you may have regarding the information given to you following your procedure. If we do not reach you, we will leave a message.     If any biopsies were taken you will be contacted by phone or by letter within the next 1-3 weeks.  Please call us at [phone] if you have not heard about the biopsies in 3 weeks.    SIGNATURES/CONFIDENTIALITY: You and/or your care partner have signed paperwork which will be entered into your electronic medical record.  These signatures attest to the fact that that the information above on your After Visit Summary has been reviewed and is understood.  Full responsibility of the confidentiality of this discharge information lies with you and/or your care-partner.

## 2023-10-06 NOTE — Progress Notes (Signed)
Vss nad trans to pacu 

## 2023-10-06 NOTE — Op Note (Signed)
[facility] Patient Name: Yvette Walton Procedure Date: 10/06/2023 11:35 AM MRN: [id number] Endoscopist: Wilhemina Bonito. Marina Goodell , MD, [id number] Age: 67 Referring MD:  Date of Birth: [birth year] Gender: Female Account #: [id number] Procedure:                Colonoscopy with cold snare polypectomy x 2 Indications:              Screening for colorectal malignant neoplasm.                            Previous examination 2014 was negative for neoplasia Medicines:                Monitored Anesthesia Care Procedure:                Pre-Anesthesia Assessment:                           - Prior to the procedure, a History and Physical                            was performed, and patient medications and                            allergies were reviewed. The patient's tolerance of                            previous anesthesia was also reviewed. The risks                            and benefits of the procedure and the sedation                            options and risks were discussed with the patient.                            All questions were answered, and informed consent                            was obtained. Prior Anticoagulants: The patient has                            taken no anticoagulant or antiplatelet agents.                            After reviewing the risks and benefits, the patient                            was deemed in satisfactory condition to undergo the                            procedure.                           After obtaining informed consent, the colonoscope  was passed under direct vision. Throughout the                            procedure, the patient's blood pressure, pulse, and                            oxygen saturations were monitored continuously. The                            Olympus Scope SN [id number] was introduced through the                            anus and advanced to the the cecum, identified by                             appendiceal orifice and ileocecal valve. The                            ileocecal valve, appendiceal orifice, and rectum                            were photographed. The quality of the bowel                            preparation was excellent. The colonoscopy was                            performed without difficulty. The patient tolerated                            the procedure well. The bowel preparation used was                            SUPREP via split dose instruction. Scope In: 11:55:31 AM Scope Out: 12:14:56 PM Scope Withdrawal Time: 0 hours 10 minutes 47 seconds  Total Procedure Duration: 0 hours 19 minutes 25 seconds  Findings:                 Two polyps were found in the ascending colon. The                            polyps were 1 to 3 mm in size. These polyps were                            removed with a cold snare. Resection and retrieval                            were complete.                           Multiple diverticula were found in the left colon.                           Internal hemorrhoids were found during  retroflexion. The hemorrhoids were small.                           The exam was otherwise without abnormality on                            direct and retroflexion views. Complications:            No immediate complications. Estimated blood loss:                            None. Estimated Blood Loss:     Estimated blood loss: none. Impression:               - Two 1 to 3 mm polyps in the ascending colon,                            removed with a cold snare. Resected and retrieved.                           - Diverticulosis in the left colon.                           - Internal hemorrhoids.                           - The examination was otherwise normal on direct                            and retroflexion views. Recommendation:           - Repeat colonoscopy in 7 years for surveillance if                             polyps adenomatous. Otherwise, no routine follow-up                            recommended.                           - Patient has a contact number available for                            emergencies. The signs and symptoms of potential                            delayed complications were discussed with the                            patient. Return to normal activities tomorrow.                            Written discharge instructions were provided to the                            patient.                           -  Resume previous diet.                           - Continue present medications.                           - Await pathology results. Wilhemina Bonito. Marina Goodell, MD 10/06/2023 12:19:19 PM This report has been signed electronically.

## 2023-10-06 NOTE — Progress Notes (Signed)
Pt's states no medical or surgical changes since previsit or office visit. 

## 2023-10-06 NOTE — Progress Notes (Signed)
Called to room to assist during endoscopic procedure.  Patient ID and intended procedure confirmed with present staff. Received instructions for my participation in the procedure from the performing physician.  

## 2023-10-07 ENCOUNTER — Telehealth: Payer: Self-pay

## 2023-10-07 NOTE — Telephone Encounter (Signed)
  Follow up Call-     10/06/2023   10:51 AM  Call back number  Post procedure Call Back phone  # [phone]  Permission to leave phone message Yes     Patient questions:  Do you have a fever, pain , or abdominal swelling? No. Pain Score  0 *  Have you tolerated food without any problems? Yes.    Have you been able to return to your normal activities? Yes.    Do you have any questions about your discharge instructions: Diet   No. Medications  No. Follow up visit  No.  Do you have questions or concerns about your Care? No.  Actions: * If pain score is 4 or above: No action needed, pain <4.

## 2023-10-09 ENCOUNTER — Encounter: Payer: Self-pay | Admitting: Internal Medicine

## 2023-10-09 LAB — SURGICAL PATHOLOGY

## 2023-10-15 ENCOUNTER — Other Ambulatory Visit: Payer: Self-pay | Admitting: Obstetrics and Gynecology

## 2023-10-15 DIAGNOSIS — Z780 Asymptomatic menopausal state: Secondary | ICD-10-CM

## 2023-10-29 ENCOUNTER — Other Ambulatory Visit: Payer: Self-pay | Admitting: Internal Medicine

## 2023-10-29 DIAGNOSIS — E876 Hypokalemia: Secondary | ICD-10-CM

## 2023-10-29 DIAGNOSIS — I10 Essential (primary) hypertension: Secondary | ICD-10-CM

## 2023-11-02 DIAGNOSIS — J45909 Unspecified asthma, uncomplicated: Secondary | ICD-10-CM | POA: Diagnosis not present

## 2023-11-02 DIAGNOSIS — E559 Vitamin D deficiency, unspecified: Secondary | ICD-10-CM | POA: Diagnosis not present

## 2023-11-02 DIAGNOSIS — I10 Essential (primary) hypertension: Secondary | ICD-10-CM | POA: Diagnosis not present

## 2023-11-03 LAB — LAB REPORT - SCANNED: EGFR: 80

## 2023-11-05 ENCOUNTER — Encounter: Payer: Self-pay | Admitting: Obstetrics and Gynecology

## 2023-11-05 ENCOUNTER — Ambulatory Visit: Payer: Medicare Other | Admitting: Obstetrics and Gynecology

## 2023-11-05 VITALS — BP 116/68 | HR 54 | Ht 66.25 in | Wt 140.0 lb

## 2023-11-05 DIAGNOSIS — B3731 Acute candidiasis of vulva and vagina: Secondary | ICD-10-CM | POA: Diagnosis not present

## 2023-11-05 DIAGNOSIS — R102 Pelvic and perineal pain: Secondary | ICD-10-CM

## 2023-11-05 DIAGNOSIS — N952 Postmenopausal atrophic vaginitis: Secondary | ICD-10-CM | POA: Diagnosis not present

## 2023-11-05 LAB — NO CULTURE INDICATED

## 2023-11-05 LAB — URINALYSIS, COMPLETE W/RFL CULTURE
Bacteria, UA: NONE SEEN /[HPF]
Bilirubin Urine: NEGATIVE
Glucose, UA: NEGATIVE
Hyaline Cast: NONE SEEN /[LPF]
Ketones, ur: NEGATIVE
Leukocyte Esterase: NEGATIVE
Nitrites, Initial: NEGATIVE
Protein, ur: NEGATIVE
RBC / HPF: NONE SEEN /[HPF] (ref 0–2)
Specific Gravity, Urine: 1.02 (ref 1.001–1.035)
WBC, UA: NONE SEEN /[HPF] (ref 0–5)
pH: 5 (ref 5.0–8.0)

## 2023-11-05 LAB — WET PREP FOR TRICH, YEAST, CLUE

## 2023-11-05 MED ORDER — FLUCONAZOLE 150 MG PO TABS: ORAL_TABLET | ORAL | 0 refills | Status: DC

## 2023-11-05 MED ORDER — PREMARIN 0.625 MG/GM VA CREA: 1.0000 | TOPICAL_CREAM | Freq: Every day | VAGINAL | 1 refills | Status: DC

## 2023-11-05 NOTE — Patient Instructions (Signed)

## 2023-11-05 NOTE — Progress Notes (Signed)
GYNECOLOGY  VISIT   HPI: 67 y.o.   Single  Caucasian female   G0P0000 with No LMP recorded. Patient is postmenopausal.   here for: vaginal infection-- notices irritation on the labia for 2 days ago.  Having burning and swelling on the outside. Pt reported taking OTC yeast guard Tuesday.   Has rx for clobetasol for chronic vulvitis.  Used Clobetasol last night, which helped.   Also noted urinary frequency.  No dysuria.   Uses vaginal estradiol cream for atrophy three times a week.  It causes burning.  She does use pads when she uses the estradiol cream.   No recent intercourse.   GYNECOLOGIC HISTORY: No LMP recorded. Patient is postmenopausal. Contraception:  PMP Menopausal hormone therapy:  estrace Last 2 paps:  02/24/22 neg, 07/18/19 neg: HR HPV neg  History of abnormal Pap or positive HPV:  no Mammogram:  02/20/23 Breast Density Cat B, BI-RADS CAT 1 neg         OB History     Gravida  0   Para  0   Term  0   Preterm  0   AB  0   Living  0      SAB  0   IAB  0   Ectopic  0   Multiple  0   Live Births  0              Patient Active Problem List   Diagnosis Date Noted   Osteoporosis, senile 06/18/2023   Bradycardia, sinus 06/18/2023   Prediabetes 06/18/2023   Stage 3a chronic kidney disease (HCC) 06/18/2023   Dyslipidemia, goal LDL below 130 12/18/2021   Encounter for general adult medical examination with abnormal findings 12/03/2020   Psychophysiological insomnia 12/03/2020   Essential hypertension 07/07/2019    Past Medical History:  Diagnosis Date   Allergic rhinitis    Asthma    Bradycardia    Elevated hemoglobin A1c 12/23/2022   6.1   Hypertension    Osteoporosis 2023   spine and hip    Past Surgical History:  Procedure Laterality Date   COLONOSCOPY  2014   NASAL SEPTUM SURGERY     TONSILLECTOMY      Current Outpatient Medications  Medication Sig Dispense Refill   alendronate (FOSAMAX) 70 MG tablet Take 1 tablet (70 mg  total) by mouth every 7 (seven) days. Take with a full glass of water on an empty stomach. 12 tablet 3   Cholecalciferol (VITAMIN D3) 125 MCG (5000 UT) CAPS Take 1 capsule by mouth daily.     clobetasol ointment (TEMOVATE) 0.05 % Place on vulva twice a day for 2 weeks for a flare and place on the vulva twice a week at bedtime for maintenance. 30 g 1   estradiol (ESTRACE) 0.1 MG/GM vaginal cream Place 1 g vaginally 3 (three) times a week. 42.5 g 2   eszopiclone 3 MG TABS Take 1 tablet (3 mg total) by mouth at bedtime. Take immediately before bedtime 90 tablet 0   indapamide (LOZOL) 1.25 MG tablet TAKE 1 TABLET BY MOUTH EVERY DAY 90 tablet 1   irbesartan (AVAPRO) 150 MG tablet TAKE 1 TABLET BY MOUTH EVERY DAY 90 tablet 0   NON FORMULARY Green Miracle  1 scoop a day     NON FORMULARY Pro - Women with D- Mannose and Cranberry (Probiotic)     potassium chloride (KLOR-CON) 8 MEQ tablet TAKE 2 TABLETS (16 MEQ TOTAL) BY MOUTH 2 (TWO) TIMES  DAILY. 360 tablet 0   terconazole (TERAZOL 3) 0.8 % vaginal cream Place 1 applicator vaginally at bedtime. Place nightly for 3 nights. 20 g 0   Cephalexin 250 MG tablet as needed. (Patient not taking: Reported on 09/18/2023)     fluconazole (DIFLUCAN) 150 MG tablet Take 150 mg by mouth daily as needed (Yeast infections). (Patient not taking: Reported on 11/05/2023)     No current facility-administered medications for this visit.     ALLERGIES: Patient has no known allergies.  Family History  Problem Relation Age of Onset   Coronary artery disease Mother    Coronary artery disease Father    Prostate cancer Father    Diabetes Father        AOD   Colon cancer Neg Hx    Esophageal cancer Neg Hx    Rectal cancer Neg Hx    Stomach cancer Neg Hx     Social History   Socioeconomic History   Marital status: Single    Spouse name: Not on file   Number of children: Not on file   Years of education: Not on file   Highest education level: Bachelor's degree (e.g.,  BA, AB, BS)  Occupational History   Occupation: Works full time    Employer: [employer]  Tobacco Use   Smoking status: Never    Passive exposure: Past   Smokeless tobacco: Never   Tobacco comments:    occasional smoker 20+ years ago. Social smoker.  Vaping Use   Vaping status: Never Used  Substance and Sexual Activity   Alcohol use: Yes    Alcohol/week: 2.0 standard drinks of alcohol    Types: 2 Glasses of wine per week   Drug use: No   Sexual activity: Yes    Birth control/protection: Post-menopausal    Comment: First IC >16y/o, <5 Partners, DES-neg  Other Topics Concern   Not on file  Social History Narrative   Not on file   Social Determinants of Health   Financial Resource Strain: Low Risk  (06/18/2023)   Overall Financial Resource Strain (CARDIA)    Difficulty of Paying Living Expenses: Not hard at all  Food Insecurity: No Food Insecurity (06/18/2023)   Hunger Vital Sign    Worried About Running Out of Food in the Last Year: Never true    Ran Out of Food in the Last Year: Never true  Transportation Needs: No Transportation Needs (06/18/2023)   PRAPARE - Administrator, Civil Service (Medical): No    Lack of Transportation (Non-Medical): No  Physical Activity: Insufficiently Active (06/18/2023)   Exercise Vital Sign    Days of Exercise per Week: 3 days    Minutes of Exercise per Session: 30 min  Stress: No Stress Concern Present (06/18/2023)   [employer] of Occupational Health - Occupational Stress Questionnaire    Feeling of Stress : Not at all  Social Connections: Moderately Isolated (06/18/2023)   Social Connection and Isolation Panel [NHANES]    Frequency of Communication with Friends and Family: More than three times a week    Frequency of Social Gatherings with Friends and Family: Twice a week    Attends Religious Services: Never    Database administrator or Organizations: No    Attends Banker Meetings: 1 to 4 times per  year    Marital Status: Never married  Intimate Partner Violence: Not At Risk (04/13/2023)   Humiliation, Afraid, Rape, and Kick questionnaire  Fear of Current or Ex-Partner: No    Emotionally Abused: No    Physically Abused: No    Sexually Abused: No    Review of Systems  Genitourinary:  Positive for pelvic pain and vaginal pain.  All other systems reviewed and are negative.   PHYSICAL EXAMINATION:   BP 116/68 (BP Location: Left Arm, Patient Position: Sitting, Cuff Size: Normal)   Pulse (!) 54   Ht 5' 6.25" (1.683 m)   Wt 140 lb (63.5 kg)   SpO2 99%   BMI 22.43 kg/m     General appearance: alert, cooperative and appears stated age  Pelvic: External genitalia:  no lesions              Urethra:  normal appearing urethra with no masses, tenderness or lesions              Bartholins and Skenes: normal                 Vagina: normal appearing vagina with normal color and discharge, no lesions              Cervix: no lesions                Bimanual Exam:  Uterus:  normal size, contour, position, consistency, mobility, non-tender              Adnexa: no mass, fullness, tenderness           Chaperone was present for exam:  Warren Lacy, CMA  ASSESSMENT:  Yeast vulvovaginitis.  Vaginal atrophy. Pelvic discomfort.   PLAN:  Wet prep:  yeast present, no clue cells, no trichomonas.  Diflucan 150 mg po x 1.  May repeat in 72 hours prn.  Switch to Premarin vaginal cream.  Try a more organic pad. Urinalysis:  negative.  No UC sent.   FU prn.   29 min  total time was spent for this patient encounter, including preparation, face-to-face counseling with the patient, coordination of care, and documentation of the encounter.

## 2023-11-13 ENCOUNTER — Ambulatory Visit (INDEPENDENT_AMBULATORY_CARE_PROVIDER_SITE_OTHER): Payer: Medicare Other | Admitting: Radiology

## 2023-11-13 VITALS — BP 110/76 | HR 54 | Resp 99

## 2023-11-13 DIAGNOSIS — N760 Acute vaginitis: Secondary | ICD-10-CM | POA: Diagnosis not present

## 2023-11-13 LAB — WET PREP FOR TRICH, YEAST, CLUE

## 2023-11-13 NOTE — Progress Notes (Signed)
      Subjective: Yvette Walton is a 67 y.o. postmenopausal female who complains of external vulvar irritation. Treated for vaginal yeast infection last week---took #2 Diflucan last week and homeopathic yeast guard this week. Still with some lingering discomfort. She was switched to premarin cream from estrace due to burning at that visit but has not started it yet. No urinary symptoms.   Review of Systems  All other systems reviewed and are negative.   Past Medical History:  Diagnosis Date   Allergic rhinitis    Asthma    Bradycardia    Elevated hemoglobin A1c 12/23/2022   6.1   Hypertension    Osteoporosis 2023   spine and hip      Objective:  Today's Vitals   11/13/23 0925  BP: 110/76  Pulse: (!) 54  Resp: (!) 99   There is no height or weight on file to calculate BMI.   Physical Exam Vitals and nursing note reviewed. Exam conducted with a chaperone present.  Constitutional:      Appearance: Normal appearance. She is normal weight.  Genitourinary:    Vagina: Normal. No vaginal discharge, erythema, tenderness or bleeding.     Comments: Mild erythema of inner labia Neurological:     Mental Status: She is alert.  Psychiatric:        Mood and Affect: Mood normal.        Thought Content: Thought content normal.        Judgment: Judgment normal.    Microscopic wet-mount exam shows negative for pathogens, normal epithelial cells.   Raynelle Fanning, CMA present for exam  Assessment:/Plan:  1. Vulvovaginitis (Primary) Start premarin cream May use clobetasol prn and aquaphor to soothe and provide a barrier - WET PREP FOR TRICH, YEAST, CLUE   Dontae Minerva B, NP 10:16 AM

## 2023-11-17 ENCOUNTER — Encounter: Payer: Self-pay | Admitting: Internal Medicine

## 2023-12-01 ENCOUNTER — Other Ambulatory Visit: Payer: Self-pay | Admitting: Internal Medicine

## 2023-12-01 DIAGNOSIS — I10 Essential (primary) hypertension: Secondary | ICD-10-CM

## 2023-12-15 DIAGNOSIS — L718 Other rosacea: Secondary | ICD-10-CM | POA: Diagnosis not present

## 2023-12-17 DIAGNOSIS — M818 Other osteoporosis without current pathological fracture: Secondary | ICD-10-CM | POA: Diagnosis not present

## 2023-12-17 DIAGNOSIS — Z0000 Encounter for general adult medical examination without abnormal findings: Secondary | ICD-10-CM | POA: Diagnosis not present

## 2023-12-17 DIAGNOSIS — E785 Hyperlipidemia, unspecified: Secondary | ICD-10-CM | POA: Diagnosis not present

## 2023-12-17 DIAGNOSIS — J45909 Unspecified asthma, uncomplicated: Secondary | ICD-10-CM | POA: Diagnosis not present

## 2023-12-31 DIAGNOSIS — H18453 Nodular corneal degeneration, bilateral: Secondary | ICD-10-CM | POA: Diagnosis not present

## 2023-12-31 DIAGNOSIS — H2513 Age-related nuclear cataract, bilateral: Secondary | ICD-10-CM | POA: Diagnosis not present

## 2023-12-31 DIAGNOSIS — H25013 Cortical age-related cataract, bilateral: Secondary | ICD-10-CM | POA: Diagnosis not present

## 2024-01-04 DIAGNOSIS — M6281 Muscle weakness (generalized): Secondary | ICD-10-CM | POA: Diagnosis not present

## 2024-01-12 DIAGNOSIS — L718 Other rosacea: Secondary | ICD-10-CM | POA: Diagnosis not present

## 2024-01-24 ENCOUNTER — Other Ambulatory Visit: Payer: Self-pay | Admitting: Internal Medicine

## 2024-01-24 DIAGNOSIS — T502X5A Adverse effect of carbonic-anhydrase inhibitors, benzothiadiazides and other diuretics, initial encounter: Secondary | ICD-10-CM

## 2024-01-24 DIAGNOSIS — I10 Essential (primary) hypertension: Secondary | ICD-10-CM

## 2024-01-27 DIAGNOSIS — N3020 Other chronic cystitis without hematuria: Secondary | ICD-10-CM | POA: Diagnosis not present

## 2024-02-01 DIAGNOSIS — M6281 Muscle weakness (generalized): Secondary | ICD-10-CM | POA: Diagnosis not present

## 2024-02-02 DIAGNOSIS — M6281 Muscle weakness (generalized): Secondary | ICD-10-CM | POA: Diagnosis not present

## 2024-02-15 DIAGNOSIS — M6281 Muscle weakness (generalized): Secondary | ICD-10-CM | POA: Diagnosis not present

## 2024-02-22 ENCOUNTER — Ambulatory Visit
Admission: RE | Admit: 2024-02-22 | Discharge: 2024-02-22 | Disposition: A | Discharge status: Home / Self Care | Payer: Medicare Other | Source: Ambulatory Visit | Attending: Internal Medicine | Admitting: Internal Medicine

## 2024-02-22 DIAGNOSIS — Z1231 Encounter for screening mammogram for malignant neoplasm of breast: Secondary | ICD-10-CM | POA: Diagnosis not present

## 2024-03-15 DIAGNOSIS — M5431 Sciatica, right side: Secondary | ICD-10-CM | POA: Diagnosis not present

## 2024-03-29 ENCOUNTER — Other Ambulatory Visit: Payer: Self-pay

## 2024-03-29 ENCOUNTER — Ambulatory Visit: Admitting: Internal Medicine

## 2024-03-29 ENCOUNTER — Encounter: Payer: Self-pay | Admitting: Internal Medicine

## 2024-03-29 VITALS — BP 132/70 | HR 51 | Temp 99.0°F | Ht 65.5 in | Wt 138.0 lb

## 2024-03-29 DIAGNOSIS — L719 Rosacea, unspecified: Secondary | ICD-10-CM

## 2024-03-29 NOTE — Patient Instructions (Addendum)
 Rosacea - No need for food testing as this is not related to food allergy . - Keep food diary to see which foods bother your rosacea and try to limit its intake. - Limit or avoid spicy foods, alcohol, hot foods as those are common triggers.  - Follow up with Dermatology regarding skin care regimen.

## 2024-03-29 NOTE — Progress Notes (Signed)
 NEW PATIENT  Date of Service/Encounter:  03/29/24  Consult requested by: Armida Berry, NP   Subjective:   Yvette  Walton (DOB: 03/12/1956) is a 68 y.o. female who presents to the clinic on 03/29/2024 with a chief complaint of Rosacea and Establish Care .    History obtained from: chart review and patient.   Rosacea:  Started in October with redness, irritation of forehead.  Saw Dermatology and was dx with rosacea.  Tried doxycycline but unable to tolerate.  Started on metronidazole  that is slowly helping.  Uses gentle skin care fragrance free products.  Wondering if this is a food allergy  and needs testing.     Reviewed:  12/15/2023: seen by Derm McConell for rosacea.   Past Medical History: Past Medical History:  Diagnosis Date   Allergic rhinitis    Asthma    Bradycardia    Elevated hemoglobin A1c 12/23/2022   6.1   Hypertension    Osteoporosis 2023   spine and hip    Past Surgical History: Past Surgical History:  Procedure Laterality Date   COLONOSCOPY  2014   NASAL SEPTUM SURGERY     TONSILLECTOMY      Family History: Family History  Problem Relation Age of Onset   Allergic rhinitis Mother    Eczema Mother    Asthma Mother    Coronary artery disease Mother    Coronary artery disease Father    Prostate cancer Father    Diabetes Father        AOD   Colon cancer Neg Hx    Esophageal cancer Neg Hx    Rectal cancer Neg Hx    Stomach cancer Neg Hx     Medication List:  Allergies as of 03/29/2024   No Known Allergies      Medication List        Accurate as of March 29, 2024 10:53 AM. If you have any questions, ask your nurse or doctor.          alendronate  70 MG tablet Commonly known as: FOSAMAX  Take 1 tablet (70 mg total) by mouth every 7 (seven) days. Take with a full glass of water on an empty stomach.   clobetasol  ointment 0.05 % Commonly known as: TEMOVATE  Place on vulva twice a day for 2 weeks for a flare and place on the vulva  twice a week at bedtime for maintenance.   Eszopiclone  3 MG Tabs Commonly known as: eszopiclone  Take 1 tablet (3 mg total) by mouth at bedtime. Take immediately before bedtime   indapamide  1.25 MG tablet Commonly known as: LOZOL  TAKE 1 TABLET BY MOUTH EVERY DAY   irbesartan  150 MG tablet Commonly known as: AVAPRO  TAKE 1 TABLET BY MOUTH EVERY DAY   NON FORMULARY Green Miracle  1 scoop a day   NON FORMULARY Pro - Women with D- Mannose and Cranberry (Probiotic)   potassium chloride  8 MEQ tablet Commonly known as: KLOR-CON  TAKE 2 TABLETS (16 MEQ TOTAL) BY MOUTH 2 (TWO) TIMES DAILY.   Premarin  vaginal cream Generic drug: conjugated estrogens  Place 1 Applicatorful vaginally daily. Use 1 gram vaginally two or three times per week as needed to maintain symptom relief.   Vitamin D3 125 MCG (5000 UT) Caps Take 1 capsule by mouth daily.         REVIEW OF SYSTEMS: Pertinent positives and negatives discussed in HPI.   Objective:   Physical Exam: BP 132/70 (BP Location: Left Arm, Patient Position: Sitting, Cuff Size: Normal)  Pulse (!) 51   Temp 99 F (37.2 C) (Temporal)   Ht 5' 5.5" (1.664 m)   Wt 138 lb (62.6 kg)   SpO2 97%   BMI 22.62 kg/m  Body mass index is 22.62 kg/m. GEN: alert, well developed HEENT: clear conjunctiva, MMM HEART: regular rate  LUNGS: no coughing, unlabored respiration ABDOMEN: soft, non distended  SKIN: slight erythema of forehead and dryness   Assessment:   1. Rosacea     Plan/Recommendations:   Rosacea - No need for food testing as this is not related to food allergy . - Keep food diary to see which foods bother your rosacea and try to limit its intake. - Limit or avoid spicy foods, alcohol, hot foods as those are common triggers.  - Follow up with Dermatology regarding skin care regimen.     Return if symptoms worsen or fail to improve.  Kristen Petri, MD [facility] 

## 2024-04-28 ENCOUNTER — Telehealth: Payer: Self-pay

## 2024-04-28 DIAGNOSIS — N952 Postmenopausal atrophic vaginitis: Secondary | ICD-10-CM

## 2024-04-28 MED ORDER — IMVEXXY STARTER PACK 10 MCG VA INST: VAGINAL_INSERT | VAGINAL | 0 refills | Status: DC

## 2024-04-28 MED ORDER — IMVEXXY MAINTENANCE PACK 10 MCG VA INST: VAGINAL_INSERT | VAGINAL | 3 refills | Status: DC

## 2024-04-28 NOTE — Addendum Note (Signed)
 Addended by: Romaine Closs on: 04/28/2024 03:48 PM   Modules accepted: Orders

## 2024-04-28 NOTE — Telephone Encounter (Signed)
 Patient called asking to switch to estrogen tablet vs the cream due to insurance will not cover the cream. Please advise.

## 2024-04-28 NOTE — Telephone Encounter (Signed)
 Imvexxy  Rx sent through [facility] for best coverage/cost.

## 2024-05-04 ENCOUNTER — Encounter: Payer: Self-pay | Admitting: Obstetrics and Gynecology

## 2024-05-04 ENCOUNTER — Telehealth: Payer: Self-pay

## 2024-05-04 ENCOUNTER — Other Ambulatory Visit: Payer: Self-pay | Admitting: Obstetrics and Gynecology

## 2024-05-04 MED ORDER — ESTRADIOL 10 MCG VA TABS: ORAL_TABLET | VAGINAL | 1 refills | Status: DC

## 2024-05-04 NOTE — Progress Notes (Signed)
 Rx for generic vaginal estradiol  tablets 10 mcg.

## 2024-05-04 NOTE — Telephone Encounter (Signed)
 Patient LM on RX line asking why RX for Imvexxy  was sent to [facility] instead of local [facility].  Message sent to patient letting her know RX was sent to [facility] for best cost.

## 2024-05-11 DIAGNOSIS — L648 Other androgenic alopecia: Secondary | ICD-10-CM | POA: Diagnosis not present

## 2024-05-11 DIAGNOSIS — L718 Other rosacea: Secondary | ICD-10-CM | POA: Diagnosis not present

## 2024-05-17 DIAGNOSIS — Z79899 Other long term (current) drug therapy: Secondary | ICD-10-CM | POA: Diagnosis not present

## 2024-05-17 DIAGNOSIS — L648 Other androgenic alopecia: Secondary | ICD-10-CM | POA: Diagnosis not present

## 2024-08-08 ENCOUNTER — Other Ambulatory Visit: Payer: Medicare Other

## 2024-08-25 ENCOUNTER — Ambulatory Visit (HOSPITAL_BASED_OUTPATIENT_CLINIC_OR_DEPARTMENT_OTHER)
Admission: RE | Admit: 2024-08-25 | Discharge: 2024-08-25 | Disposition: A | Discharge status: Home / Self Care | Source: Ambulatory Visit | Attending: Obstetrics and Gynecology | Admitting: Obstetrics and Gynecology

## 2024-08-25 ENCOUNTER — Ambulatory Visit: Payer: Self-pay | Admitting: Obstetrics and Gynecology

## 2024-08-25 ENCOUNTER — Other Ambulatory Visit: Payer: Self-pay | Admitting: Obstetrics and Gynecology

## 2024-08-25 DIAGNOSIS — Z780 Asymptomatic menopausal state: Secondary | ICD-10-CM | POA: Diagnosis present

## 2024-08-25 DIAGNOSIS — M810 Age-related osteoporosis without current pathological fracture: Secondary | ICD-10-CM

## 2024-08-25 NOTE — Telephone Encounter (Signed)
 Med refill request: FOSAMAX   Last AEX:  02/24/22 last OV 11/13/23 Next AEX: 10/03/24 Last MMG (if hormonal med) 02/22/24 BIRADS Cat 1 neg  Refill authorized: last rx 10/01/23 #12 with 3 refills please approve or deny

## 2024-08-26 ENCOUNTER — Telehealth: Payer: Self-pay | Admitting: *Deleted

## 2024-08-26 ENCOUNTER — Ambulatory Visit: Admitting: Radiology

## 2024-08-26 ENCOUNTER — Encounter: Payer: Self-pay | Admitting: Radiology

## 2024-08-26 VITALS — BP 124/72 | Temp 98.2°F | Wt 145.0 lb

## 2024-08-26 DIAGNOSIS — N9489 Other specified conditions associated with female genital organs and menstrual cycle: Secondary | ICD-10-CM | POA: Diagnosis not present

## 2024-08-26 DIAGNOSIS — R300 Dysuria: Secondary | ICD-10-CM | POA: Diagnosis not present

## 2024-08-26 DIAGNOSIS — B3731 Acute candidiasis of vulva and vagina: Secondary | ICD-10-CM

## 2024-08-26 LAB — WET PREP FOR TRICH, YEAST, CLUE

## 2024-08-26 NOTE — Telephone Encounter (Signed)
 You can put her in my 3pm slot

## 2024-08-26 NOTE — Telephone Encounter (Signed)
 Spoke with patient, OV scheduled for 1500 today with JC.   Encounter closed.

## 2024-08-26 NOTE — Telephone Encounter (Signed)
 Spoke with patient. Reports burning around labia when urine touches skin. Reports urinary frequency, decreased urinary output and odor in urine. Symptoms started on 08/25/24. States she has a Hx of UTIs, has been over 1 year per patient since she has had one.   Denies vaginal discharge, bleeding, fever/chills or flank pain.   Last OV 11/13/23 -JC  Patient agreeable to lab appointment if appropriate.   Jami -please review and advise.

## 2024-08-26 NOTE — Progress Notes (Signed)
      Subjective: Yvette Walton is a 68 y.o. postmenopausal female who complains of  labial burning, irritation. Labia burns when urine touches skin, urinary frequency, decreased urinary output and odor in urine. Symptoms began 08/25/24.   Review of Systems  All other systems reviewed and are negative.   Past Medical History:  Diagnosis Date   Allergic rhinitis    Asthma    Bradycardia    Elevated hemoglobin A1c 12/23/2022   6.1   Hypertension    Osteoporosis 2023   spine and hip      Objective:  Today's Vitals   08/26/24 1458  BP: 124/72  Temp: 98.2 F (36.8 C)  TempSrc: Oral  Weight: 145 lb (65.8 kg)   Body mass index is 23.76 kg/m.   Physical Exam Vitals and nursing note reviewed. Exam conducted with a chaperone present.  Constitutional:      Appearance: Normal appearance. She is normal weight.  Genitourinary:    Vagina: Normal. No vaginal discharge, erythema, tenderness or bleeding.     Comments: Mild erythema of inner labia Neurological:     Mental Status: She is alert.  Psychiatric:        Mood and Affect: Mood normal.        Thought Content: Thought content normal.        Judgment: Judgment normal.    Darice Hoit, CMA present for exam  Urine dipstick shows positive for WBC's.  Micro exam: 6-10 WBC's per HPF.   Microscopic wet-mount exam shows hyphae.   Assessment:/Plan:  1. Vulvar burning (Primary) - WET PREP FOR TRICH, YEAST, CLUE  2. Dysuria - Urinalysis,Complete w/RFL Culture  3. Yeast vaginitis Declines Rx today. Will begin monistat tonight that she has at home   Eugene Zeiders B, NP 3:06 PM

## 2024-08-28 LAB — URINALYSIS, COMPLETE W/RFL CULTURE
Bilirubin Urine: NEGATIVE
Glucose, UA: NEGATIVE
Hgb urine dipstick: NEGATIVE
Hyaline Cast: NONE SEEN /LPF
Ketones, ur: NEGATIVE
Nitrites, Initial: NEGATIVE
Protein, ur: NEGATIVE
RBC / HPF: NONE SEEN /HPF (ref 0–2)
Specific Gravity, Urine: 1.015 (ref 1.001–1.035)
pH: 5.5 (ref 5.0–8.0)

## 2024-08-28 LAB — URINE CULTURE
MICRO NUMBER:: 17022689
Result:: NO GROWTH
SPECIMEN QUALITY:: ADEQUATE

## 2024-08-28 LAB — CULTURE INDICATED

## 2024-09-29 NOTE — Progress Notes (Signed)
 68 y.o. G0P0000 Single Caucasian female here for a breast and pelvic exam.    The patient is also followed for recurrent vulvitis, vaginal atrophy, hx UTIs, and osteoporosis.   Treated for yeast infection 08/26/24. Used over the counter Monistat.  UC was negative.   She notes more yeast infections when she increases her sugar/carb intake. Her A1C was 6.0 on 06/18/23.   Using Vagifem .  Does best not using vaginal estrogen creams, which have caused irritation.    Sees Dr. Elisabeth from urology.   Taking Fosamax  for hx osteoporosis.  No problems with the medication.    Uses Clobetasol  on the vulva.   Has seen her dermatologist for hair loss. Had a negative work up.     Partner has Paediatric Nurse.   PCP: Barbra Odor, NP - [facility].   No LMP recorded. Patient is postmenopausal.           Sexually active: Not currently   The current method of family planning is post menopausal status.    Menopausal hormone therapy:  Estradiol   Exercising: Yes.    Gym 1x week, cardio and weight class. Walking 4x week  Smoker:  Former   OB History     Gravida  0   Para  0   Term  0   Preterm  0   AB  0   Living  0      SAB  0   IAB  0   Ectopic  0   Multiple  0   Live Births  0           HEALTH MAINTENANCE: Last 2 paps: 02/24/22 neg, 07/18/19 neg  History of abnormal Pap or positive HPV:  no Mammogram:  02/22/24 Breast Density Cat B, BIRADS Cat 1 neg  Colonoscopy:  10/06/23 Bone Density:  08/25/24  Result  osteopenia hips and spine.   Immunization History  Administered Date(s) Administered   Fluad Quad(high Dose 65+) 08/18/2022, 08/18/2022   H1N1 01/01/2009   INFLUENZA, HIGH DOSE SEASONAL PF 09/17/2022, 07/24/2023   Influenza Split 08/31/2012, 09/22/2019, 08/27/2020   Influenza Whole 08/31/2010   Influenza,inj,Quad PF,6+ Mos 11/04/2018, 09/22/2019   Influenza-Unspecified 09/21/2021   Moderna Covid-19 Fall Seasonal Vaccine 29yrs & older  09/17/2022   PFIZER Comirnaty(Gray Top)Covid-19 Tri-Sucrose Vaccine 08/18/2022   PFIZER(Purple Top)SARS-COV-2 Vaccination 03/11/2020, 04/02/2020, 11/11/2020   PNEUMOCOCCAL CONJUGATE-20 06/12/2021   Pfizer Covid-19 Vaccine Bivalent Booster 56yrs & up 12/19/2021, 08/18/2022   Pneumococcal Polysaccharide-23 12/01/2005, 03/22/2014   Respiratory Syncytial Virus Vaccine,Recomb Aduvanted(Arexvy) 09/09/2022   Tdap 05/14/2006, 08/26/2017   Zoster Recombinant(Shingrix ) 12/02/2018, 04/26/2019      reports that she has quit smoking. Her smoking use included cigarettes. She has been exposed to tobacco smoke. She has never used smokeless tobacco. She reports current alcohol use of about 2.0 standard drinks of alcohol per week. She reports that she does not use drugs.  Past Medical History:  Diagnosis Date   Allergic rhinitis    Asthma    Bradycardia    Elevated hemoglobin A1c 12/23/2022   6.1   Hypertension    Osteoporosis 2023   spine and hip   Rosacea     Past Surgical History:  Procedure Laterality Date   COLONOSCOPY  2014   NASAL SEPTUM SURGERY     TONSILLECTOMY      Current Outpatient Medications  Medication Sig Dispense Refill   alendronate  (FOSAMAX ) 70 MG tablet TAKE 1 TABLET (70 MG TOTAL) BY MOUTH EVERY 7  DAYS WITH FULL GLASS WATER ON EMPTY STOMACH 12 tablet 0   Cholecalciferol (VITAMIN D3) 125 MCG (5000 UT) CAPS Take 1 capsule by mouth daily.     clobetasol  ointment (TEMOVATE ) 0.05 % Place on vulva twice a day for 2 weeks for a flare and place on the vulva twice a week at bedtime for maintenance. 30 g 1   eszopiclone  3 MG TABS Take 1 tablet (3 mg total) by mouth at bedtime. Take immediately before bedtime 90 tablet 0   indapamide  (LOZOL ) 1.25 MG tablet TAKE 1 TABLET BY MOUTH EVERY DAY 90 tablet 1   irbesartan  (AVAPRO ) 150 MG tablet TAKE 1 TABLET BY MOUTH EVERY DAY 90 tablet 0   NON FORMULARY Green Miracle  1 scoop a day     NON FORMULARY Pro - Women with D- Mannose and Cranberry  (Probiotic)     potassium chloride  (KLOR-CON ) 8 MEQ tablet TAKE 2 TABLETS (16 MEQ TOTAL) BY MOUTH 2 (TWO) TIMES DAILY. 360 tablet 0   Estradiol  10 MCG TABS vaginal tablet Place one tablet (10 mcg) per vagina at bedtime twice weekly. 24 tablet 3   No current facility-administered medications for this visit.    ALLERGIES: Patient has no known allergies.  Family History  Problem Relation Age of Onset   Allergic rhinitis Mother    Eczema Mother    Asthma Mother    Coronary artery disease Mother    Coronary artery disease Father    Prostate cancer Father    Diabetes Father        AOD   Colon cancer Neg Hx    Esophageal cancer Neg Hx    Rectal cancer Neg Hx    Stomach cancer Neg Hx     Review of Systems  All other systems reviewed and are negative.   PHYSICAL EXAM:  BP 120/82 (BP Location: Left Arm, Patient Position: Sitting)   Pulse (!) 49   Ht 5' 6.5 (1.689 m)   Wt 143 lb (64.9 kg)   SpO2 100%   BMI 22.74 kg/m     General appearance: alert, cooperative and appears stated age Head: normocephalic, without obvious abnormality, atraumatic Neck: no adenopathy, supple, symmetrical, trachea midline and thyroid  normal to inspection and palpation Lungs: clear to auscultation bilaterally Breasts: normal appearance, no masses or tenderness, No nipple retraction or dimpling, No nipple discharge or bleeding, No axillary adenopathy Heart: regular rate and rhythm Abdomen: soft, non-tender; no masses, no organomegaly Extremities: extremities normal, atraumatic, no cyanosis or edema Skin: skin color, texture, turgor normal. No rashes or lesions Lymph nodes: cervical, supraclavicular, and axillary nodes normal. Neurologic: grossly normal  Pelvic: External genitalia:  no lesions              No abnormal inguinal nodes palpated.              Urethra:  normal appearing urethra with no masses, tenderness or lesions              Bartholins and Skenes: normal                 Vagina: normal  appearing vagina with normal color and discharge, no lesions              Cervix: no lesions              Pap taken: no Bimanual Exam:  Uterus:  normal size, contour, position, consistency, mobility, non-tender  Adnexa: no mass, fullness, tenderness              Rectal exam: yes.  Confirms.              Anus:  normal sphincter tone, no lesions  Chaperone was present for exam:  Kari HERO, CMA  ASSESSMENT: Encounter for breast and pelvic exam.  Vaginal atrophy.  Doing well with Vagifem .  Hx UTI.  Chronic vulvitis. Usually related to yeast.  Has elevated A1C.   Hx osteoporosis.  On Fosamax .  Hair loss.  Negative work up per patient.    PLAN: Mammogram screening discussed. Self breast awareness reviewed. Pap and HRV collected:  no.  Due in 2026.  Guidelines for Calcium, Vitamin D , regular exercise program including cardiovascular and weight bearing exercise. Medication refills:  Fosamax  70 mg weekly.  #12, RF 3. Vagifem  vaginally twice weekly.  #24, RF 3. Clobetasol  ointment.    We discussed vulvodynia as a potential cause for vulvar burning pain.  This is a diagnosis of exclusion after infection is ruled out.   I recommend a diet low on sugar and carbohydrate and more rich in protein.  She will return for a TSH if she finds that she has not had this testing.  Next BMD in 2027.  Follow up:  yearly and prn.     Additional counseling given.  yes. 40 min  total time was spent for this patient encounter, including preparation, face-to-face counseling with the patient, coordination of care, and documentation of the encounter in addition to doing the breast and pelvic exam.

## 2024-10-03 ENCOUNTER — Encounter: Payer: Self-pay | Admitting: Obstetrics and Gynecology

## 2024-10-03 ENCOUNTER — Ambulatory Visit: Payer: Medicare Other | Admitting: Obstetrics and Gynecology

## 2024-10-03 VITALS — BP 120/82 | HR 49 | Ht 66.5 in | Wt 143.0 lb

## 2024-10-03 DIAGNOSIS — M810 Age-related osteoporosis without current pathological fracture: Secondary | ICD-10-CM | POA: Diagnosis not present

## 2024-10-03 DIAGNOSIS — N952 Postmenopausal atrophic vaginitis: Secondary | ICD-10-CM | POA: Diagnosis not present

## 2024-10-03 DIAGNOSIS — L659 Nonscarring hair loss, unspecified: Secondary | ICD-10-CM | POA: Diagnosis not present

## 2024-10-03 DIAGNOSIS — Z5181 Encounter for therapeutic drug level monitoring: Secondary | ICD-10-CM

## 2024-10-03 DIAGNOSIS — Z01419 Encounter for gynecological examination (general) (routine) without abnormal findings: Secondary | ICD-10-CM

## 2024-10-03 MED ORDER — ALENDRONATE SODIUM 70 MG PO TABS: 70.0000 mg | ORAL_TABLET | ORAL | 3 refills | Status: AC

## 2024-10-03 MED ORDER — ESTRADIOL 10 MCG VA TABS: ORAL_TABLET | VAGINAL | 3 refills | Status: AC

## 2024-10-03 MED ORDER — CLOBETASOL PROPIONATE 0.05 % EX OINT: TOPICAL_OINTMENT | CUTANEOUS | 1 refills | Status: AC

## 2024-10-03 NOTE — Patient Instructions (Signed)

## 2025-10-09 ENCOUNTER — Ambulatory Visit: Admitting: Obstetrics and Gynecology
